# Patient Record
Sex: Female | Born: 1965 | Race: White | Hispanic: No | Marital: Married | Smoking: Former smoker
Health system: Southern US, Community
[De-identification: ages and names within clinical notes are randomized; demographics above are authoritative.]

## PROBLEM LIST (undated history)

## (undated) DIAGNOSIS — H9192 Unspecified hearing loss, left ear: Secondary | ICD-10-CM

## (undated) DIAGNOSIS — H9191 Unspecified hearing loss, right ear: Secondary | ICD-10-CM

## (undated) DIAGNOSIS — E079 Disorder of thyroid, unspecified: Secondary | ICD-10-CM

## (undated) HISTORY — DX: Unspecified hearing loss, right ear: H91.91

## (undated) HISTORY — DX: Unspecified hearing loss, left ear: H91.92

## (undated) HISTORY — PX: OTHER SURGICAL HISTORY: SHX169

## (undated) HISTORY — DX: Disorder of thyroid, unspecified: E07.9

---

## 1997-09-14 ENCOUNTER — Inpatient Hospital Stay (HOSPITAL_COMMUNITY): Admission: AD | Admit: 1997-09-14 | Discharge: 1997-09-14 | Payer: Self-pay | Admitting: Obstetrics and Gynecology

## 1998-02-17 ENCOUNTER — Inpatient Hospital Stay (HOSPITAL_COMMUNITY): Admission: AD | Admit: 1998-02-17 | Discharge: 1998-02-24 | Payer: Self-pay | Admitting: Obstetrics and Gynecology

## 1998-02-18 ENCOUNTER — Encounter: Payer: Self-pay | Admitting: Obstetrics and Gynecology

## 1998-02-24 ENCOUNTER — Encounter (HOSPITAL_COMMUNITY): Admission: RE | Admit: 1998-02-24 | Discharge: 1998-05-25 | Payer: Self-pay | Admitting: Obstetrics and Gynecology

## 2000-01-02 ENCOUNTER — Other Ambulatory Visit: Admission: RE | Admit: 2000-01-02 | Discharge: 2000-01-02 | Payer: Self-pay | Admitting: Obstetrics and Gynecology

## 2000-07-26 ENCOUNTER — Inpatient Hospital Stay (HOSPITAL_COMMUNITY): Admission: AD | Admit: 2000-07-26 | Discharge: 2000-07-28 | Payer: Self-pay | Admitting: Obstetrics and Gynecology

## 2000-08-29 ENCOUNTER — Other Ambulatory Visit: Admission: RE | Admit: 2000-08-29 | Discharge: 2000-08-29 | Payer: Self-pay | Admitting: Obstetrics and Gynecology

## 2003-01-05 ENCOUNTER — Other Ambulatory Visit: Admission: RE | Admit: 2003-01-05 | Discharge: 2003-01-05 | Payer: Self-pay | Admitting: Obstetrics and Gynecology

## 2004-12-21 ENCOUNTER — Other Ambulatory Visit: Admission: RE | Admit: 2004-12-21 | Discharge: 2004-12-21 | Payer: Self-pay | Admitting: Obstetrics and Gynecology

## 2005-01-03 ENCOUNTER — Ambulatory Visit (HOSPITAL_COMMUNITY): Admission: RE | Admit: 2005-01-03 | Discharge: 2005-01-03 | Payer: Self-pay | Admitting: Obstetrics and Gynecology

## 2005-01-03 ENCOUNTER — Encounter (INDEPENDENT_AMBULATORY_CARE_PROVIDER_SITE_OTHER): Payer: Self-pay | Admitting: *Deleted

## 2007-03-15 ENCOUNTER — Encounter: Admission: RE | Admit: 2007-03-15 | Discharge: 2007-03-15 | Payer: Self-pay | Admitting: Internal Medicine

## 2008-03-17 ENCOUNTER — Encounter: Admission: RE | Admit: 2008-03-17 | Discharge: 2008-03-17 | Payer: Self-pay | Admitting: Internal Medicine

## 2008-03-17 ENCOUNTER — Encounter (INDEPENDENT_AMBULATORY_CARE_PROVIDER_SITE_OTHER): Payer: Self-pay | Admitting: *Deleted

## 2008-06-27 ENCOUNTER — Emergency Department (HOSPITAL_COMMUNITY): Admission: EM | Admit: 2008-06-27 | Discharge: 2008-06-27 | Payer: Self-pay | Admitting: Emergency Medicine

## 2008-10-29 DIAGNOSIS — R51 Headache: Secondary | ICD-10-CM | POA: Insufficient documentation

## 2008-10-29 DIAGNOSIS — E039 Hypothyroidism, unspecified: Secondary | ICD-10-CM | POA: Insufficient documentation

## 2008-10-29 DIAGNOSIS — R519 Headache, unspecified: Secondary | ICD-10-CM | POA: Insufficient documentation

## 2008-10-30 ENCOUNTER — Ambulatory Visit: Payer: Self-pay | Admitting: Internal Medicine

## 2008-10-30 DIAGNOSIS — K7689 Other specified diseases of liver: Secondary | ICD-10-CM | POA: Insufficient documentation

## 2008-10-30 DIAGNOSIS — R740 Nonspecific elevation of levels of transaminase and lactic acid dehydrogenase [LDH]: Secondary | ICD-10-CM

## 2008-10-30 DIAGNOSIS — R7401 Elevation of levels of liver transaminase levels: Secondary | ICD-10-CM | POA: Insufficient documentation

## 2008-10-30 DIAGNOSIS — R932 Abnormal findings on diagnostic imaging of liver and biliary tract: Secondary | ICD-10-CM | POA: Insufficient documentation

## 2008-10-30 DIAGNOSIS — R7402 Elevation of levels of lactic acid dehydrogenase (LDH): Secondary | ICD-10-CM | POA: Insufficient documentation

## 2008-10-30 LAB — CONVERTED CEMR LAB
A-1 Antitrypsin, Ser: 186 mg/dL (ref 83–200)
HCV Ab: NEGATIVE
Prothrombin Time: 10.9 s (ref 9.1–11.7)
Saturation Ratios: 9.6 % — ABNORMAL LOW (ref 20.0–50.0)
Transferrin: 357.8 mg/dL (ref 212.0–360.0)

## 2008-11-25 ENCOUNTER — Ambulatory Visit: Payer: Self-pay | Admitting: Internal Medicine

## 2012-07-25 ENCOUNTER — Other Ambulatory Visit: Payer: Self-pay | Admitting: Otolaryngology

## 2012-07-25 DIAGNOSIS — H9319 Tinnitus, unspecified ear: Secondary | ICD-10-CM

## 2012-07-25 DIAGNOSIS — H903 Sensorineural hearing loss, bilateral: Secondary | ICD-10-CM

## 2012-07-25 DIAGNOSIS — H905 Unspecified sensorineural hearing loss: Secondary | ICD-10-CM

## 2012-08-07 ENCOUNTER — Other Ambulatory Visit: Payer: Self-pay

## 2013-04-10 ENCOUNTER — Other Ambulatory Visit: Payer: Self-pay | Admitting: Internal Medicine

## 2013-04-10 DIAGNOSIS — R7989 Other specified abnormal findings of blood chemistry: Secondary | ICD-10-CM

## 2013-04-10 DIAGNOSIS — R945 Abnormal results of liver function studies: Principal | ICD-10-CM

## 2013-04-11 ENCOUNTER — Ambulatory Visit
Admission: RE | Admit: 2013-04-11 | Discharge: 2013-04-11 | Disposition: A | Discharge status: Home / Self Care | Payer: BC Managed Care – PPO | Source: Ambulatory Visit | Attending: Internal Medicine | Admitting: Internal Medicine

## 2013-04-11 ENCOUNTER — Other Ambulatory Visit: Payer: Self-pay | Admitting: Internal Medicine

## 2013-04-11 DIAGNOSIS — R945 Abnormal results of liver function studies: Principal | ICD-10-CM

## 2013-04-11 DIAGNOSIS — R7989 Other specified abnormal findings of blood chemistry: Secondary | ICD-10-CM

## 2013-04-24 ENCOUNTER — Ambulatory Visit (INDEPENDENT_AMBULATORY_CARE_PROVIDER_SITE_OTHER): Payer: BC Managed Care – PPO

## 2013-04-24 ENCOUNTER — Ambulatory Visit: Payer: BC Managed Care – PPO | Admitting: Podiatry

## 2013-04-24 ENCOUNTER — Encounter: Payer: Self-pay | Admitting: Podiatry

## 2013-04-24 VITALS — BP 126/75 | HR 82 | Resp 16 | Ht 67.5 in | Wt 194.0 lb

## 2013-04-24 DIAGNOSIS — M722 Plantar fascial fibromatosis: Secondary | ICD-10-CM

## 2013-04-24 MED ORDER — METHYLPREDNISOLONE (PAK) 4 MG PO TABS: ORAL_TABLET | ORAL | Status: DC

## 2013-04-24 MED ORDER — MELOXICAM 15 MG PO TABS: 15.0000 mg | ORAL_TABLET | Freq: Every day | ORAL | Status: DC

## 2013-04-24 NOTE — Patient Instructions (Signed)
Plantar Fasciitis (Heel Spur Syndrome) with Rehab The plantar fascia is a fibrous, ligament-like, soft-tissue structure that spans the bottom of the foot. Plantar fasciitis is a condition that causes pain in the foot due to inflammation of the tissue. SYMPTOMS   Pain and tenderness on the underneath side of the foot.  Pain that worsens with standing or walking. CAUSES  Plantar fasciitis is caused by irritation and injury to the plantar fascia on the underneath side of the foot. Common mechanisms of injury include:  Direct trauma to bottom of the foot.  Damage to a small nerve that runs under the foot where the main fascia attaches to the heel bone.  Stress placed on the plantar fascia due to bone spurs. RISK INCREASES WITH:   Activities that place stress on the plantar fascia (running, jumping, pivoting, or cutting).  Poor strength and flexibility.  Improperly fitted shoes.  Tight calf muscles.  Flat feet.  Failure to warm-up properly before activity.  Obesity. PREVENTION  Warm up and stretch properly before activity.  Allow for adequate recovery between workouts.  Maintain physical fitness:  Strength, flexibility, and endurance.  Cardiovascular fitness.  Maintain a health body weight.  Avoid stress on the plantar fascia.  Wear properly fitted shoes, including arch supports for individuals who have flat feet. PROGNOSIS  If treated properly, then the symptoms of plantar fasciitis usually resolve without surgery. However, occasionally surgery is necessary. RELATED COMPLICATIONS   Recurrent symptoms that may result in a chronic condition.  Problems of the lower back that are caused by compensating for the injury, such as limping.  Pain or weakness of the foot during push-off following surgery.  Chronic inflammation, scarring, and partial or complete fascia tear, occurring more often from repeated injections. TREATMENT  Treatment initially involves the use of  ice and medication to help reduce pain and inflammation. The use of strengthening and stretching exercises may help reduce pain with activity, especially stretches of the Achilles tendon. These exercises may be performed at home or with a therapist. Your caregiver may recommend that you use heel cups of arch supports to help reduce stress on the plantar fascia. Occasionally, corticosteroid injections are given to reduce inflammation. If symptoms persist for greater than 6 months despite non-surgical (conservative), then surgery may be recommended.  MEDICATION   If pain medication is necessary, then nonsteroidal anti-inflammatory medications, such as aspirin and ibuprofen, or other minor pain relievers, such as acetaminophen, are often recommended.  Do not take pain medication within 7 days before surgery.  Prescription pain relievers may be given if deemed necessary by your caregiver. Use only as directed and only as much as you need.  Corticosteroid injections may be given by your caregiver. These injections should be reserved for the most serious cases, because they may only be given a certain number of times. HEAT AND COLD  Cold treatment (icing) relieves pain and reduces inflammation. Cold treatment should be applied for 10 to 15 minutes every 2 to 3 hours for inflammation and pain and immediately after any activity that aggravates your symptoms. Use ice packs or massage the area with a piece of ice (ice massage).  Heat treatment may be used prior to performing the stretching and strengthening activities prescribed by your caregiver, physical therapist, or athletic trainer. Use a heat pack or soak the injury in warm water. SEEK IMMEDIATE MEDICAL CARE IF:  Treatment seems to offer no benefit, or the condition worsens.  Any medications produce adverse side effects. EXERCISES RANGE   OF MOTION (ROM) AND STRETCHING EXERCISES - Plantar Fasciitis (Heel Spur Syndrome) These exercises may help you  when beginning to rehabilitate your injury. Your symptoms may resolve with or without further involvement from your physician, physical therapist or athletic trainer. While completing these exercises, remember:   Restoring tissue flexibility helps normal motion to return to the joints. This allows healthier, less painful movement and activity.  An effective stretch should be held for at least 30 seconds.  A stretch should never be painful. You should only feel a gentle lengthening or release in the stretched tissue. RANGE OF MOTION - Toe Extension, Flexion  Sit with your right / left leg crossed over your opposite knee.  Grasp your toes and gently pull them back toward the top of your foot. You should feel a stretch on the bottom of your toes and/or foot.  Hold this stretch for __________ seconds.  Now, gently pull your toes toward the bottom of your foot. You should feel a stretch on the top of your toes and or foot.  Hold this stretch for __________ seconds. Repeat __________ times. Complete this stretch __________ times per day.  RANGE OF MOTION - Ankle Dorsiflexion, Active Assisted  Remove shoes and sit on a chair that is preferably not on a carpeted surface.  Place right / left foot under knee. Extend your opposite leg for support.  Keeping your heel down, slide your right / left foot back toward the chair until you feel a stretch at your ankle or calf. If you do not feel a stretch, slide your bottom forward to the edge of the chair, while still keeping your heel down.  Hold this stretch for __________ seconds. Repeat __________ times. Complete this stretch __________ times per day.  STRETCH  Gastroc, Standing  Place hands on wall.  Extend right / left leg, keeping the front knee somewhat bent.  Slightly point your toes inward on your back foot.  Keeping your right / left heel on the floor and your knee straight, shift your weight toward the wall, not allowing your back to  arch.  You should feel a gentle stretch in the right / left calf. Hold this position for __________ seconds. Repeat __________ times. Complete this stretch __________ times per day. STRETCH  Soleus, Standing  Place hands on wall.  Extend right / left leg, keeping the other knee somewhat bent.  Slightly point your toes inward on your back foot.  Keep your right / left heel on the floor, bend your back knee, and slightly shift your weight over the back leg so that you feel a gentle stretch deep in your back calf.  Hold this position for __________ seconds. Repeat __________ times. Complete this stretch __________ times per day. STRETCH  Gastrocsoleus, Standing  Note: This exercise can place a lot of stress on your foot and ankle. Please complete this exercise only if specifically instructed by your caregiver.   Place the ball of your right / left foot on a step, keeping your other foot firmly on the same step.  Hold on to the wall or a rail for balance.  Slowly lift your other foot, allowing your body weight to press your heel down over the edge of the step.  You should feel a stretch in your right / left calf.  Hold this position for __________ seconds.  Repeat this exercise with a slight bend in your right / left knee. Repeat __________ times. Complete this stretch __________ times per day.    STRENGTHENING EXERCISES - Plantar Fasciitis (Heel Spur Syndrome)  These exercises may help you when beginning to rehabilitate your injury. They may resolve your symptoms with or without further involvement from your physician, physical therapist or athletic trainer. While completing these exercises, remember:   Muscles can gain both the endurance and the strength needed for everyday activities through controlled exercises.  Complete these exercises as instructed by your physician, physical therapist or athletic trainer. Progress the resistance and repetitions only as guided. STRENGTH - Towel  Curls  Sit in a chair positioned on a non-carpeted surface.  Place your foot on a towel, keeping your heel on the floor.  Pull the towel toward your heel by only curling your toes. Keep your heel on the floor.  If instructed by your physician, physical therapist or athletic trainer, add ____________________ at the end of the towel. Repeat __________ times. Complete this exercise __________ times per day. STRENGTH - Ankle Inversion  Secure one end of a rubber exercise band/tubing to a fixed object (table, pole). Loop the other end around your foot just before your toes.  Place your fists between your knees. This will focus your strengthening at your ankle.  Slowly, pull your big toe up and in, making sure the band/tubing is positioned to resist the entire motion.  Hold this position for __________ seconds.  Have your muscles resist the band/tubing as it slowly pulls your foot back to the starting position. Repeat __________ times. Complete this exercises __________ times per day.  Document Released: 02/06/2005 Document Revised: 05/01/2011 Document Reviewed: 05/21/2008 [employer] Patient Information 2014 [employer]. Plantar Fasciitis Plantar fasciitis is a common condition that causes foot pain. It is soreness (inflammation) of the band of tough fibrous tissue on the bottom of the foot that runs from the heel bone (calcaneus) to the ball of the foot. The cause of this soreness may be from excessive standing, poor fitting shoes, running on hard surfaces, being overweight, having an abnormal walk, or overuse (this is common in runners) of the painful foot or feet. It is also common in aerobic exercise dancers and ballet dancers. SYMPTOMS  Most people with plantar fasciitis complain of:  Severe pain in the morning on the bottom of their foot especially when taking the first steps out of bed. This pain recedes after a few minutes of walking.  Severe pain is experienced also during walking  following a long period of inactivity.  Pain is worse when walking barefoot or up stairs DIAGNOSIS   Your caregiver will diagnose this condition by examining and feeling your foot.  Special tests such as X-rays of your foot, are usually not needed. PREVENTION   Consult a sports medicine professional before beginning a new exercise program.  Walking programs offer a good workout. With walking there is a lower chance of overuse injuries common to runners. There is less impact and less jarring of the joints.  Begin all new exercise programs slowly. If problems or pain develop, decrease the amount of time or distance until you are at a comfortable level.  Wear good shoes and replace them regularly.  Stretch your foot and the heel cords at the back of the ankle (Achilles tendon) both before and after exercise.  Run or exercise on even surfaces that are not hard. For example, asphalt is better than pavement.  Do not run barefoot on hard surfaces.  If using a treadmill, vary the incline.  Do not continue to workout if you have foot or joint   problems. Seek professional help if they do not improve. HOME CARE INSTRUCTIONS   Avoid activities that cause you pain until you recover.  Use ice or cold packs on the problem or painful areas after working out.  Only take over-the-counter or prescription medicines for pain, discomfort, or fever as directed by your caregiver.  Soft shoe inserts or athletic shoes with air or gel sole cushions may be helpful.  If problems continue or become more severe, consult a sports medicine caregiver or your own health care provider. Cortisone is a potent anti-inflammatory medication that may be injected into the painful area. You can discuss this treatment with your caregiver. MAKE SURE YOU:   Understand these instructions.  Will watch your condition.  Will get help right away if you are not doing well or get worse. Document Released: 11/01/2000 Document  Revised: 05/01/2011 Document Reviewed: 01/01/2008 [employer] Patient Information 2014 [employer].  

## 2013-04-24 NOTE — Progress Notes (Signed)
   Subjective:    Patient ID: Judith Stewart, female    DOB: Oct 18, 1965, 48 y.o.   MRN: [id number]  HPI Comments: Right foot heel pain noticed in December kind of a faint pain, mornings are worse like a stabbing pain, as day goes on it gets a little better, but always feel it . It has got worse since first noticing , compensating walking on the heel. Starting to feel it go up the back of the heel. Been using tylenol and stretching and flexing foot .   Foot Pain      Review of Systems  HENT:       Hearing aid in right ear and deaf in left ear  Musculoskeletal:       Difficulty walking due to foot pain   Neurological:       Migraines   All other systems reviewed and are negative.       Objective:   Physical Exam: I have reviewed her past medical history medications allergies surgeries and social history. Pulses are strongly palpable right foot. Neurologic sensorium is intact per since once the monofilament. Deep tendon reflexes are intact bilateral muscle strength is 5 over 5 dorsiflexors plantar flexors inverters everters all intrinsic musculature is intact. Orthopedic evaluation demonstrates all joints distal to the ankle have a full range of motion without crepitation. She has pain on palpation medial calcaneal tubercle of the right heel. Radiographic evaluation does demonstrate plantar distally oriented calcaneal heel spur with soft tissue increase in density at the plantar fascial calcaneal insertion site. Cutaneous evaluation demonstrates supple well hydrated cutis no erythema edema cellulitis drainage or odor.        Assessment & Plan:  Assessment: Plantar fasciitis right foot.  Plan: Discussed the etiology pathology conservative versus surgical therapies. Injected the right heel today with Kenalog and local anesthetic. Plantar fascial brace was dispensed. Plantar fascial night splint was dispensed. We prescribed a Medrol Dosepak to be followed by [facility]. We discussed appropriate  shoe gear stretching exercises ice therapy and shoe gear modifications area I will followup with her in one month.

## 2013-05-22 ENCOUNTER — Ambulatory Visit (INDEPENDENT_AMBULATORY_CARE_PROVIDER_SITE_OTHER): Payer: BC Managed Care – PPO | Admitting: Podiatry

## 2013-05-22 ENCOUNTER — Encounter: Payer: Self-pay | Admitting: Podiatry

## 2013-05-22 VITALS — BP 112/71 | HR 88 | Resp 16

## 2013-05-22 DIAGNOSIS — M722 Plantar fascial fibromatosis: Secondary | ICD-10-CM

## 2013-05-22 DIAGNOSIS — M2010 Hallux valgus (acquired), unspecified foot: Secondary | ICD-10-CM

## 2013-05-22 NOTE — Progress Notes (Signed)
She presents today for followup of a plantar fasciitis left foot. She states it is about 80% better.  Objective: Vital signs are stable she is alert and oriented x3. She has minimal pain on palpation medial continued tubercle of the left heel.  Assessment: Plantar fasciitis resolving.  Plan: Continue all conservative therapies until she is 1 month out. I will followup with her as needed.

## 2013-07-30 ENCOUNTER — Ambulatory Visit (INDEPENDENT_AMBULATORY_CARE_PROVIDER_SITE_OTHER): Payer: BC Managed Care – PPO | Admitting: General Surgery

## 2013-07-30 ENCOUNTER — Encounter (INDEPENDENT_AMBULATORY_CARE_PROVIDER_SITE_OTHER): Payer: Self-pay | Admitting: General Surgery

## 2013-07-30 VITALS — BP 126/80 | HR 78 | Temp 97.8°F | Ht 67.0 in | Wt 196.0 lb

## 2013-07-30 DIAGNOSIS — E65 Localized adiposity: Secondary | ICD-10-CM

## 2013-07-30 NOTE — Progress Notes (Signed)
Chief complaint: Excess tissue right axilla  History: Patient comes to the office referred by Dr. Matthew Saras due to excess tissue or fat pad in the right axilla. She has noticed this for a number of years but it appears to be getting gradually larger over time. She notices a little enlargement bilaterally but significantly more on the right than the left.  This is not painful. No fluctuation with menstrual cycle. There does seem to be gradual enlargement. Gives her difficulty with clothing and somewhat getting in the way of her upper arm.  Past Medical History  Diagnosis Date  . Thyroid disease   . Deafness in left ear   . Partial deafness of right ear     hearing aid for right ear    Past Surgical History  Procedure Laterality Date  . Cesarean section    . Laprascopic surgery     Current Outpatient Prescriptions  Medication Sig Dispense Refill  . levothyroxine (SYNTHROID, LEVOTHROID) 175 MCG tablet Take 175 mcg by mouth daily before breakfast.      . norgestrel-ethinyl estradiol (LO/OVRAL,CRYSELLE) 0.3-30 MG-MCG tablet Take 1 tablet by mouth daily.       No current facility-administered medications for this visit.   No Known Allergies History  Substance Use Topics  . Smoking status: Former Smoker    Quit date: 07/30/1993  . Smokeless tobacco: Never Used  . Alcohol Use: No   Exam: BP 126/80  Pulse 78  Temp(Src) 97.8 F (36.6 C)  Ht 5\' 7"  (1.702 m)  Wt 196 lb (88.905 kg)  BMI 30.69 kg/m2 Overweight but well-appearing Caucasian female. Lymph nodes: No cervical supraclavicular or axillary nodes palpable Skin: No rash or infection Lungs: No wheezing or increased work of breathing Breasts: No masses. In the right axilla is a very noticeable ptotic fold of tissue and skin extending from just anterior to just posterior to the axilla. No distinct mass within this. She has a little bit of excess skin and soft tissue in the left axilla but not nearly the degree of the right  side.  Assessment and plan: Excess tissue right axilla consistent with right axillary fat pad. It is asymmetric and enlarging. Cannot rule out ectopic breast tissue. I believe she would need significant excision of skin as well as underlying soft tissue. I described the procedure an incision that would be used and recovery as well as risks of bleeding infection anesthetic complications. I discussed about be happy to c a plastic surgeon to see if they had any other ideas. She will consider her options and get back to me.

## 2015-06-28 DIAGNOSIS — Z6833 Body mass index (BMI) 33.0-33.9, adult: Secondary | ICD-10-CM | POA: Diagnosis not present

## 2015-06-28 DIAGNOSIS — Z01419 Encounter for gynecological examination (general) (routine) without abnormal findings: Secondary | ICD-10-CM | POA: Diagnosis not present

## 2015-06-28 DIAGNOSIS — Z1231 Encounter for screening mammogram for malignant neoplasm of breast: Secondary | ICD-10-CM | POA: Diagnosis not present

## 2015-10-26 DIAGNOSIS — E038 Other specified hypothyroidism: Secondary | ICD-10-CM | POA: Diagnosis not present

## 2015-10-26 DIAGNOSIS — E784 Other hyperlipidemia: Secondary | ICD-10-CM | POA: Diagnosis not present

## 2015-10-26 DIAGNOSIS — Z23 Encounter for immunization: Secondary | ICD-10-CM | POA: Diagnosis not present

## 2015-10-26 DIAGNOSIS — K769 Liver disease, unspecified: Secondary | ICD-10-CM | POA: Diagnosis not present

## 2015-10-26 DIAGNOSIS — R030 Elevated blood-pressure reading, without diagnosis of hypertension: Secondary | ICD-10-CM | POA: Diagnosis not present

## 2015-10-26 DIAGNOSIS — E668 Other obesity: Secondary | ICD-10-CM | POA: Diagnosis not present

## 2016-04-18 DIAGNOSIS — E038 Other specified hypothyroidism: Secondary | ICD-10-CM | POA: Diagnosis not present

## 2016-04-18 DIAGNOSIS — Z0000 Encounter for general adult medical examination without abnormal findings: Secondary | ICD-10-CM | POA: Diagnosis not present

## 2016-04-25 DIAGNOSIS — K7689 Other specified diseases of liver: Secondary | ICD-10-CM | POA: Diagnosis not present

## 2016-04-25 DIAGNOSIS — Z1389 Encounter for screening for other disorder: Secondary | ICD-10-CM | POA: Diagnosis not present

## 2016-04-25 DIAGNOSIS — M25521 Pain in right elbow: Secondary | ICD-10-CM | POA: Diagnosis not present

## 2016-04-25 DIAGNOSIS — R030 Elevated blood-pressure reading, without diagnosis of hypertension: Secondary | ICD-10-CM | POA: Diagnosis not present

## 2016-04-25 DIAGNOSIS — Z0000 Encounter for general adult medical examination without abnormal findings: Secondary | ICD-10-CM | POA: Diagnosis not present

## 2016-04-25 DIAGNOSIS — E784 Other hyperlipidemia: Secondary | ICD-10-CM | POA: Diagnosis not present

## 2016-05-05 ENCOUNTER — Encounter: Payer: Self-pay | Admitting: Internal Medicine

## 2016-07-06 ENCOUNTER — Ambulatory Visit (AMBULATORY_SURGERY_CENTER): Payer: Self-pay

## 2016-07-06 VITALS — Ht 67.0 in | Wt 216.6 lb

## 2016-07-06 DIAGNOSIS — Z1211 Encounter for screening for malignant neoplasm of colon: Secondary | ICD-10-CM

## 2016-07-06 MED ORDER — NA SULFATE-K SULFATE-MG SULF 17.5-3.13-1.6 GM/177ML PO SOLN: 1.0000 | Freq: Once | ORAL | 0 refills | Status: AC

## 2016-07-06 NOTE — Progress Notes (Signed)
Denies allergies to eggs or soy products. Denies complication of anesthesia or sedation. Denies use of weight loss medication. Denies use of O2.   Emmi instructions given for colonoscopy.  

## 2016-07-10 ENCOUNTER — Encounter: Payer: Self-pay | Admitting: Internal Medicine

## 2016-07-20 ENCOUNTER — Ambulatory Visit (AMBULATORY_SURGERY_CENTER): Payer: BLUE CROSS/BLUE SHIELD | Admitting: Internal Medicine

## 2016-07-20 ENCOUNTER — Encounter: Payer: Self-pay | Admitting: Internal Medicine

## 2016-07-20 VITALS — BP 102/54 | HR 71 | Temp 97.3°F | Resp 11 | Ht 67.0 in | Wt 216.0 lb

## 2016-07-20 DIAGNOSIS — D122 Benign neoplasm of ascending colon: Secondary | ICD-10-CM | POA: Diagnosis not present

## 2016-07-20 DIAGNOSIS — Z1212 Encounter for screening for malignant neoplasm of rectum: Secondary | ICD-10-CM

## 2016-07-20 DIAGNOSIS — Z1211 Encounter for screening for malignant neoplasm of colon: Secondary | ICD-10-CM

## 2016-07-20 MED ORDER — SODIUM CHLORIDE 0.9 % IV SOLN: 500.0000 mL | INTRAVENOUS | Status: DC

## 2016-07-20 NOTE — Progress Notes (Signed)
Called to room to assist during endoscopic procedure.  Patient ID and intended procedure confirmed with present staff. Received instructions for my participation in the procedure from the performing physician.  

## 2016-07-20 NOTE — Op Note (Signed)
[place] Patient Name: Judith Stewart Procedure Date: 07/20/2016 1:21 PM MRN: [id number] Endoscopist: Docia Chuck. Henrene Pastor , MD Age: 51 Referring MD:  Date of Birth: [birth year] Gender: Female Account #: [id number] Procedure:                Colonoscopy, with cold snare polypectomy X2 Indications:              Screening for colorectal malignant neoplasm Medicines:                Monitored Anesthesia Care Procedure:                Pre-Anesthesia Assessment:                           - Prior to the procedure, a History and Physical                            was performed, and patient medications and                            allergies were reviewed. The patient's tolerance of                            previous anesthesia was also reviewed. The risks                            and benefits of the procedure and the sedation                            options and risks were discussed with the patient.                            All questions were answered, and informed consent                            was obtained. Prior Anticoagulants: The patient has                            taken no previous anticoagulant or antiplatelet                            agents. ASA Grade Assessment: II - A patient with                            mild systemic disease. After reviewing the risks                            and benefits, the patient was deemed in                            satisfactory condition to undergo the procedure.                           After obtaining informed consent, the colonoscope  was passed under direct vision. Throughout the                            procedure, the patient's blood pressure, pulse, and                            oxygen saturations were monitored continuously. The                            Model CF-HQ190L [id number] [id number]) scope was introduced                            through the anus and advanced to the the cecum,             identified by appendiceal orifice and ileocecal                            valve. The terminal ileum, ileocecal valve,                            appendiceal orifice, and rectum were photographed.                            The quality of the bowel preparation was excellent.                            The colonoscopy was performed without difficulty.                            The patient tolerated the procedure well. The bowel                            preparation used was SUPREP. Scope In: 1:32:30 PM Scope Out: 1:51:13 PM Scope Withdrawal Time: 0 hours 15 minutes 51 seconds  Total Procedure Duration: 0 hours 18 minutes 43 seconds  Findings:                 The terminal ileum appeared normal.                           Two polyps were found in the ascending colon. The                            polyps were 1 to 2 mm in size. These polyps were                            removed with a cold snare. Resection and retrieval                            were complete.                           Internal hemorrhoids were found during  retroflexion. The hemorrhoids were small.                           The exam was otherwise without abnormality on                            direct and retroflexion views. The appendix was                            inverted the cecal lumen. Complications:            No immediate complications. Estimated blood loss:                            None. Estimated Blood Loss:     Estimated blood loss: none. Impression:               - The examined portion of the ileum was normal.                           - Two 1 to 2 mm polyps in the ascending colon,                            removed with a cold snare. Resected and retrieved.                           - Internal hemorrhoids.                           - The examination was otherwise normal on direct                            and retroflexion views. Recommendation:           - Repeat  colonoscopy in 5-10 years for surveillance.                           - Patient has a contact number available for                            emergencies. The signs and symptoms of potential                            delayed complications were discussed with the                            patient. Return to normal activities tomorrow.                            Written discharge instructions were provided to the                            patient.                           - Resume previous diet.                           -  Continue present medications.                           - Await pathology results. Docia Chuck. Henrene Pastor, MD 07/20/2016 1:56:58 PM This report has been signed electronically.

## 2016-07-20 NOTE — Patient Instructions (Signed)
Impression/Recommendations:  Polyp handout given to patient. Hemorrhoid handout given to patient.  Repeat colonoscopy in 5-10 years for surveillance.  Resume previous diet. Continue present medications.  YOU HAD AN ENDOSCOPIC PROCEDURE TODAY AT [facility]:   Refer to the procedure report that was given to you for any specific questions about what was found during the examination.  If the procedure report does not answer your questions, please call your gastroenterologist to clarify.  If you requested that your care partner not be given the details of your procedure findings, then the procedure report has been included in a sealed envelope for you to review at your convenience later.  YOU SHOULD EXPECT: Some feelings of bloating in the abdomen. Passage of more gas than usual.  Walking can help get rid of the air that was put into your GI tract during the procedure and reduce the bloating. If you had a lower endoscopy (such as a colonoscopy or flexible sigmoidoscopy) you may notice spotting of blood in your stool or on the toilet paper. If you underwent a bowel prep for your procedure, you may not have a normal bowel movement for a few days.  Please Note:  You might notice some irritation and congestion in your nose or some drainage.  This is from the oxygen used during your procedure.  There is no need for concern and it should clear up in a day or so.  SYMPTOMS TO REPORT IMMEDIATELY:   Following lower endoscopy (colonoscopy or flexible sigmoidoscopy):  Excessive amounts of blood in the stool  Significant tenderness or worsening of abdominal pains  Swelling of the abdomen that is new, acute  Fever of 100F or higher  For urgent or emergent issues, a gastroenterologist can be reached at any hour by calling [phone].   DIET:  We do recommend a small meal at first, but then you may proceed to your regular diet.  Drink plenty of fluids but you should avoid alcoholic  beverages for 24 hours.  ACTIVITY:  You should plan to take it easy for the rest of today and you should NOT DRIVE or use heavy machinery until tomorrow (because of the sedation medicines used during the test).    FOLLOW UP: Our staff will call the number listed on your records the next business day following your procedure to check on you and address any questions or concerns that you may have regarding the information given to you following your procedure. If we do not reach you, we will leave a message.  However, if you are feeling well and you are not experiencing any problems, there is no need to return our call.  We will assume that you have returned to your regular daily activities without incident.  If any biopsies were taken you will be contacted by phone or by letter within the next 1-3 weeks.  Please call us at [phone] if you have not heard about the biopsies in 3 weeks.    SIGNATURES/CONFIDENTIALITY: You and/or your care partner have signed paperwork which will be entered into your electronic medical record.  These signatures attest to the fact that that the information above on your After Visit Summary has been reviewed and is understood.  Full responsibility of the confidentiality of this discharge information lies with you and/or your care-partner.

## 2016-07-20 NOTE — Progress Notes (Signed)
Report to PACU, RN, vss, BBS= Clear.  

## 2016-07-20 NOTE — Progress Notes (Signed)
Pt's states no medical or surgical changes since previsit or office visit. 

## 2016-07-21 ENCOUNTER — Telehealth: Payer: Self-pay

## 2016-07-21 NOTE — Telephone Encounter (Signed)
   Follow up Call-  Call back number 07/20/2016  Post procedure Call Back phone  # [phone]  Permission to leave phone message Yes  Some recent data might be hidden     Patient questions:  Do you have a fever, pain , or abdominal swelling? No. Pain Score  0 *  Have you tolerated food without any problems? Yes.    Have you been able to return to your normal activities? Yes.    Do you have any questions about your discharge instructions: Diet   No. Medications  No. Follow up visit  No.  Do you have questions or concerns about your Care? No.  Actions: * If pain score is 4 or above: No action needed, pain <4.

## 2016-07-25 ENCOUNTER — Encounter: Payer: Self-pay | Admitting: Internal Medicine

## 2016-08-08 DIAGNOSIS — Z6833 Body mass index (BMI) 33.0-33.9, adult: Secondary | ICD-10-CM | POA: Diagnosis not present

## 2016-08-08 DIAGNOSIS — Z01419 Encounter for gynecological examination (general) (routine) without abnormal findings: Secondary | ICD-10-CM | POA: Diagnosis not present

## 2016-08-08 DIAGNOSIS — Z1231 Encounter for screening mammogram for malignant neoplasm of breast: Secondary | ICD-10-CM | POA: Diagnosis not present

## 2016-11-14 DIAGNOSIS — K769 Liver disease, unspecified: Secondary | ICD-10-CM | POA: Diagnosis not present

## 2016-11-14 DIAGNOSIS — N951 Menopausal and female climacteric states: Secondary | ICD-10-CM | POA: Diagnosis not present

## 2016-11-14 DIAGNOSIS — R030 Elevated blood-pressure reading, without diagnosis of hypertension: Secondary | ICD-10-CM | POA: Diagnosis not present

## 2016-11-14 DIAGNOSIS — M25561 Pain in right knee: Secondary | ICD-10-CM | POA: Diagnosis not present

## 2016-11-14 DIAGNOSIS — E038 Other specified hypothyroidism: Secondary | ICD-10-CM | POA: Diagnosis not present

## 2016-11-14 DIAGNOSIS — F4321 Adjustment disorder with depressed mood: Secondary | ICD-10-CM | POA: Diagnosis not present

## 2016-11-14 DIAGNOSIS — Z23 Encounter for immunization: Secondary | ICD-10-CM | POA: Diagnosis not present

## 2017-04-20 DIAGNOSIS — Z0000 Encounter for general adult medical examination without abnormal findings: Secondary | ICD-10-CM | POA: Diagnosis not present

## 2017-04-20 DIAGNOSIS — E038 Other specified hypothyroidism: Secondary | ICD-10-CM | POA: Diagnosis not present

## 2017-04-27 DIAGNOSIS — Z0000 Encounter for general adult medical examination without abnormal findings: Secondary | ICD-10-CM | POA: Diagnosis not present

## 2017-04-27 DIAGNOSIS — F4321 Adjustment disorder with depressed mood: Secondary | ICD-10-CM | POA: Diagnosis not present

## 2017-04-27 DIAGNOSIS — M25561 Pain in right knee: Secondary | ICD-10-CM | POA: Diagnosis not present

## 2017-04-27 DIAGNOSIS — R030 Elevated blood-pressure reading, without diagnosis of hypertension: Secondary | ICD-10-CM | POA: Diagnosis not present

## 2017-04-27 DIAGNOSIS — Z1389 Encounter for screening for other disorder: Secondary | ICD-10-CM | POA: Diagnosis not present

## 2017-04-27 DIAGNOSIS — Z23 Encounter for immunization: Secondary | ICD-10-CM | POA: Diagnosis not present

## 2017-04-27 DIAGNOSIS — K7689 Other specified diseases of liver: Secondary | ICD-10-CM | POA: Diagnosis not present

## 2017-09-26 DIAGNOSIS — Z1231 Encounter for screening mammogram for malignant neoplasm of breast: Secondary | ICD-10-CM | POA: Diagnosis not present

## 2017-09-26 DIAGNOSIS — Z6833 Body mass index (BMI) 33.0-33.9, adult: Secondary | ICD-10-CM | POA: Diagnosis not present

## 2017-09-26 DIAGNOSIS — Z01419 Encounter for gynecological examination (general) (routine) without abnormal findings: Secondary | ICD-10-CM | POA: Diagnosis not present

## 2017-11-07 DIAGNOSIS — Z780 Asymptomatic menopausal state: Secondary | ICD-10-CM | POA: Diagnosis not present

## 2017-12-20 DIAGNOSIS — Z6833 Body mass index (BMI) 33.0-33.9, adult: Secondary | ICD-10-CM | POA: Diagnosis not present

## 2017-12-20 DIAGNOSIS — R42 Dizziness and giddiness: Secondary | ICD-10-CM | POA: Diagnosis not present

## 2017-12-20 DIAGNOSIS — E038 Other specified hypothyroidism: Secondary | ICD-10-CM | POA: Diagnosis not present

## 2018-01-07 DIAGNOSIS — H9311 Tinnitus, right ear: Secondary | ICD-10-CM | POA: Diagnosis not present

## 2018-01-07 DIAGNOSIS — H903 Sensorineural hearing loss, bilateral: Secondary | ICD-10-CM | POA: Diagnosis not present

## 2018-01-07 DIAGNOSIS — H9121 Sudden idiopathic hearing loss, right ear: Secondary | ICD-10-CM | POA: Diagnosis not present

## 2018-01-09 ENCOUNTER — Other Ambulatory Visit: Payer: Self-pay | Admitting: Physician Assistant

## 2018-01-09 DIAGNOSIS — H9311 Tinnitus, right ear: Secondary | ICD-10-CM

## 2018-01-09 DIAGNOSIS — H9121 Sudden idiopathic hearing loss, right ear: Secondary | ICD-10-CM

## 2018-01-31 DIAGNOSIS — H829 Vertiginous syndromes in diseases classified elsewhere, unspecified ear: Secondary | ICD-10-CM | POA: Diagnosis not present

## 2018-01-31 DIAGNOSIS — H903 Sensorineural hearing loss, bilateral: Secondary | ICD-10-CM | POA: Diagnosis not present

## 2018-01-31 DIAGNOSIS — H9311 Tinnitus, right ear: Secondary | ICD-10-CM | POA: Diagnosis not present

## 2018-02-07 ENCOUNTER — Other Ambulatory Visit: Payer: Self-pay | Admitting: Otolaryngology

## 2018-02-07 DIAGNOSIS — H903 Sensorineural hearing loss, bilateral: Secondary | ICD-10-CM

## 2018-02-07 DIAGNOSIS — H9311 Tinnitus, right ear: Secondary | ICD-10-CM

## 2018-02-07 DIAGNOSIS — H829 Vertiginous syndromes in diseases classified elsewhere, unspecified ear: Secondary | ICD-10-CM

## 2018-02-11 ENCOUNTER — Other Ambulatory Visit: Payer: Self-pay | Admitting: Physician Assistant

## 2018-02-11 ENCOUNTER — Other Ambulatory Visit: Payer: Self-pay | Admitting: Otolaryngology

## 2018-02-19 ENCOUNTER — Ambulatory Visit
Admission: RE | Admit: 2018-02-19 | Discharge: 2018-02-19 | Disposition: A | Discharge status: Home / Self Care | Payer: BLUE CROSS/BLUE SHIELD | Source: Ambulatory Visit | Attending: Otolaryngology | Admitting: Otolaryngology

## 2018-02-19 ENCOUNTER — Ambulatory Visit
Admission: RE | Admit: 2018-02-19 | Discharge: 2018-02-19 | Disposition: A | Discharge status: Home / Self Care | Payer: BLUE CROSS/BLUE SHIELD | Source: Ambulatory Visit | Attending: Physician Assistant | Admitting: Physician Assistant

## 2018-02-19 DIAGNOSIS — H829 Vertiginous syndromes in diseases classified elsewhere, unspecified ear: Secondary | ICD-10-CM

## 2018-02-19 DIAGNOSIS — H9311 Tinnitus, right ear: Secondary | ICD-10-CM | POA: Diagnosis not present

## 2018-02-19 DIAGNOSIS — H9191 Unspecified hearing loss, right ear: Secondary | ICD-10-CM | POA: Diagnosis not present

## 2018-02-19 DIAGNOSIS — H9121 Sudden idiopathic hearing loss, right ear: Secondary | ICD-10-CM

## 2018-02-19 DIAGNOSIS — H903 Sensorineural hearing loss, bilateral: Secondary | ICD-10-CM | POA: Diagnosis not present

## 2018-02-19 MED ORDER — GADOBENATE DIMEGLUMINE 529 MG/ML IV SOLN
20.0000 mL | Freq: Once | INTRAVENOUS | Status: AC | PRN
  Administered 2018-02-19: 20 mL via INTRAVENOUS

## 2018-05-03 ENCOUNTER — Other Ambulatory Visit: Payer: Self-pay | Admitting: Internal Medicine

## 2018-05-03 DIAGNOSIS — E785 Hyperlipidemia, unspecified: Secondary | ICD-10-CM

## 2018-05-10 ENCOUNTER — Ambulatory Visit
Admission: RE | Admit: 2018-05-10 | Discharge: 2018-05-10 | Disposition: A | Discharge status: Home / Self Care | Payer: Self-pay | Source: Ambulatory Visit | Attending: Internal Medicine | Admitting: Internal Medicine

## 2018-05-10 DIAGNOSIS — E785 Hyperlipidemia, unspecified: Secondary | ICD-10-CM

## 2019-04-25 ENCOUNTER — Ambulatory Visit: Payer: BLUE CROSS/BLUE SHIELD

## 2019-06-30 ENCOUNTER — Encounter: Payer: Self-pay | Admitting: Podiatry

## 2019-06-30 ENCOUNTER — Other Ambulatory Visit: Payer: Self-pay

## 2019-06-30 ENCOUNTER — Ambulatory Visit (INDEPENDENT_AMBULATORY_CARE_PROVIDER_SITE_OTHER): Payer: 59

## 2019-06-30 ENCOUNTER — Ambulatory Visit (INDEPENDENT_AMBULATORY_CARE_PROVIDER_SITE_OTHER): Payer: 59 | Admitting: Podiatry

## 2019-06-30 DIAGNOSIS — M2041 Other hammer toe(s) (acquired), right foot: Secondary | ICD-10-CM | POA: Diagnosis not present

## 2019-06-30 DIAGNOSIS — M79671 Pain in right foot: Secondary | ICD-10-CM

## 2019-06-30 DIAGNOSIS — M21619 Bunion of unspecified foot: Secondary | ICD-10-CM

## 2019-06-30 DIAGNOSIS — M79672 Pain in left foot: Secondary | ICD-10-CM

## 2019-06-30 DIAGNOSIS — M2042 Other hammer toe(s) (acquired), left foot: Secondary | ICD-10-CM

## 2019-06-30 NOTE — Patient Instructions (Signed)
Bunion  A bunion is a bump on the base of the big toe that forms when the bones of the big toe joint move out of position. Bunions may be small at first, but they often get larger over time. They can make walking painful. What are the causes? A bunion may be caused by:  Wearing narrow or pointed shoes that force the big toe to press against the other toes.  Abnormal foot development that causes the foot to roll inward (pronate).  Changes in the foot that are caused by certain diseases, such as rheumatoid arthritis or polio.  A foot injury. What increases the risk? The following factors may make you more likely to develop this condition:  Wearing shoes that squeeze the toes together.  Having certain diseases, such as: ? Rheumatoid arthritis. ? Polio. ? Cerebral palsy.  Having family members who have bunions.  Being born with a foot deformity, such as flat feet or low arches.  Doing activities that put a lot of pressure on the feet, such as ballet dancing. What are the signs or symptoms? The main symptom of a bunion is a noticeable bump on the big toe. Other symptoms may include:  Pain.  Swelling around the big toe.  Redness and inflammation.  Thick or hardened skin on the big toe or between the toes.  Stiffness or loss of motion in the big toe.  Trouble with walking. How is this diagnosed? A bunion may be diagnosed based on your symptoms, medical history, and activities. You may have tests, such as:  X-rays. These allow your health care provider to check the position of the bones in your foot and look for damage to your joint. They also help your health care provider determine the severity of your bunion and the best way to treat it.  Joint aspiration. In this test, a sample of fluid is removed from the toe joint. This test may be done if you are in a lot of pain. It helps rule out diseases that cause painful swelling of the joints, such as arthritis. How is this  treated? Treatment depends on the severity of your symptoms. The goal of treatment is to relieve symptoms and prevent the bunion from getting worse. Your health care provider may recommend:  Wearing shoes that have a wide toe box.  Using bunion pads to cushion the affected area.  Taping your toes together to keep them in a normal position.  Placing a device inside your shoe (orthotics) to help reduce pressure on your toe joint.  Taking medicine to ease pain, inflammation, and swelling.  Applying heat or ice to the affected area.  Doing stretching exercises.  Surgery to remove scar tissue and move the toes back into their normal position. This treatment is rare. Follow these instructions at home: Managing pain, stiffness, and swelling   If directed, put ice on the painful area: ? Put ice in a plastic bag. ? Place a towel between your skin and the bag. ? Leave the ice on for 20 minutes, 2-3 times a day. Activity   If directed, apply heat to the affected area before you exercise. Use the heat source that your health care provider recommends, such as a moist heat pack or a heating pad. ? Place a towel between your skin and the heat source. ? Leave the heat on for 20-30 minutes. ? Remove the heat if your skin turns bright red. This is especially important if you are unable to feel pain,   heat, or cold. You may have a greater risk of getting burned.  Do exercises as told by your health care provider. General instructions  Support your toe joint with proper footwear, shoe padding, or taping as told by your health care provider.  Take over-the-counter and prescription medicines only as told by your health care provider.  Keep all follow-up visits as told by your health care provider. This is important. Contact a health care provider if your symptoms:  Get worse.  Do not improve in 2 weeks. Get help right away if you have:  Severe pain and trouble with walking. Summary  A  bunion is a bump on the base of the big toe that forms when the bones of the big toe joint move out of position.  Bunions can make walking painful.  Treatment depends on the severity of your symptoms.  Support your toe joint with proper footwear, shoe padding, or taping as told by your health care provider. This information is not intended to replace advice given to you by your health care provider. Make sure you discuss any questions you have with your health care provider. Document Revised: 08/13/2017 Document Reviewed: 06/19/2017 Elsevier Patient Education  2020 [employer]  

## 2019-07-03 NOTE — Progress Notes (Signed)
Subjective:   Patient ID: Judith Stewart, female   DOB: 54 y.o.   MRN: [id number]   HPI 54 year old female presents the office with concerns of bunion pain to both feet with the left side worse than the right.  She states that she has noticed her toes during appointment as well.  Hurts with and without shoes.  She has tried shoe modifications with any significant improvement.  She was discussed other treatment options at this time.   Review of Systems  All other systems reviewed and are negative.  Past Medical History:  Diagnosis Date  . Deafness in left ear   . Partial deafness of right ear    hearing aid for right ear   . Thyroid disease     Past Surgical History:  Procedure Laterality Date  . CESAREAN SECTION    . IVF    . laprascopic surgery       Current Outpatient Medications:  .  levothyroxine (SYNTHROID, LEVOTHROID) 175 MCG tablet, Take 175 mcg by mouth daily before breakfast., Disp: , Rfl:   Current Facility-Administered Medications:  .  0.9 %  sodium chloride infusion, 500 mL, Intravenous, Continuous, Irene Shipper, MD  No Known Allergies       Objective:  Physical Exam  General: AAO x3, NAD  Dermatological: Mild erythema along the bunion sites from irritation.  There is no open sore, warmth or any signs of infection.  Vascular: Dorsalis Pedis artery and Posterior Tibial artery pedal pulses are 2/4 bilateral with immedate capillary fill time. There is no pain with calf compression, swelling, warmth, erythema.   Neruologic: Grossly intact via light touch bilateral. Vibratory intact via tuning fork bilateral. Protective threshold with Semmes Wienstein monofilament intact to all pedal sites bilateral. Patellar and Achilles deep tendon reflexes 2+ bilateral. No Babinski or clonus noted bilateral.   Musculoskeletal: Severe bunion deformity is present on the right side worse than left.  There is hypermobility present to pressure.  There is no pain or crepitation or  restriction of first MPJ range of motion.  No other areas of discomfort identified.  Muscular strength 5/5 in all groups tested bilateral.  Gait: Unassisted, Nonantalgic.       Assessment:   Bunion deformity right side worse than left     Plan:  -Treatment options discussed including all alternatives, risks, and complications -Etiology of symptoms were discussed -X-rays were obtained and reviewed with the patient.  Severe bunion deformities present on the left side worse than right.  There is no evidence of acute fracture.  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 -We discussed both conservative as well as surgical treatment options.  At this time she is in continue with shoe modification, offloading and padding.  She was to consider surgical intervention.  Discussed left foot Lapidus bunionectomy.  We will check insurance and she is going to discuss with her husband.  We discussed the surgery as well as postoperative course we discussed all alternatives, risks, complications today.  Trula Slade DPM

## 2020-01-22 ENCOUNTER — Other Ambulatory Visit: Payer: Self-pay | Admitting: Obstetrics and Gynecology

## 2020-01-22 DIAGNOSIS — R928 Other abnormal and inconclusive findings on diagnostic imaging of breast: Secondary | ICD-10-CM

## 2020-02-19 ENCOUNTER — Other Ambulatory Visit: Payer: Self-pay | Admitting: Obstetrics and Gynecology

## 2020-02-19 ENCOUNTER — Ambulatory Visit
Admission: RE | Admit: 2020-02-19 | Discharge: 2020-02-19 | Disposition: A | Discharge status: Home / Self Care | Payer: No Typology Code available for payment source | Source: Ambulatory Visit | Attending: Obstetrics and Gynecology | Admitting: Obstetrics and Gynecology

## 2020-02-19 ENCOUNTER — Other Ambulatory Visit: Payer: Self-pay

## 2020-02-19 DIAGNOSIS — R928 Other abnormal and inconclusive findings on diagnostic imaging of breast: Secondary | ICD-10-CM

## 2020-02-19 DIAGNOSIS — R921 Mammographic calcification found on diagnostic imaging of breast: Secondary | ICD-10-CM

## 2020-03-01 ENCOUNTER — Ambulatory Visit
Admission: RE | Admit: 2020-03-01 | Discharge: 2020-03-01 | Disposition: A | Discharge status: Home / Self Care | Payer: No Typology Code available for payment source | Source: Ambulatory Visit | Attending: Obstetrics and Gynecology | Admitting: Obstetrics and Gynecology

## 2020-03-01 ENCOUNTER — Other Ambulatory Visit: Payer: Self-pay

## 2020-03-01 DIAGNOSIS — R921 Mammographic calcification found on diagnostic imaging of breast: Secondary | ICD-10-CM

## 2020-09-03 ENCOUNTER — Other Ambulatory Visit: Payer: Self-pay | Admitting: General Surgery

## 2020-09-03 DIAGNOSIS — Z09 Encounter for follow-up examination after completed treatment for conditions other than malignant neoplasm: Secondary | ICD-10-CM

## 2020-10-05 ENCOUNTER — Other Ambulatory Visit: Payer: Self-pay | Admitting: General Surgery

## 2020-10-05 DIAGNOSIS — N6489 Other specified disorders of breast: Secondary | ICD-10-CM

## 2020-10-08 ENCOUNTER — Other Ambulatory Visit: Payer: Self-pay

## 2020-10-08 ENCOUNTER — Ambulatory Visit
Admission: RE | Admit: 2020-10-08 | Discharge: 2020-10-08 | Disposition: A | Discharge status: Home / Self Care | Payer: No Typology Code available for payment source | Source: Ambulatory Visit | Attending: General Surgery | Admitting: General Surgery

## 2020-10-08 ENCOUNTER — Other Ambulatory Visit: Payer: Self-pay | Admitting: General Surgery

## 2020-10-08 DIAGNOSIS — N6489 Other specified disorders of breast: Secondary | ICD-10-CM

## 2021-04-12 ENCOUNTER — Ambulatory Visit
Admission: RE | Admit: 2021-04-12 | Discharge: 2021-04-12 | Disposition: A | Discharge status: Home / Self Care | Payer: BC Managed Care – PPO | Source: Ambulatory Visit | Attending: General Surgery | Admitting: General Surgery

## 2021-04-12 DIAGNOSIS — R922 Inconclusive mammogram: Secondary | ICD-10-CM | POA: Diagnosis not present

## 2021-04-12 DIAGNOSIS — N6489 Other specified disorders of breast: Secondary | ICD-10-CM

## 2021-04-19 DIAGNOSIS — L814 Other melanin hyperpigmentation: Secondary | ICD-10-CM | POA: Diagnosis not present

## 2021-04-19 DIAGNOSIS — Z872 Personal history of diseases of the skin and subcutaneous tissue: Secondary | ICD-10-CM | POA: Diagnosis not present

## 2021-04-19 DIAGNOSIS — L821 Other seborrheic keratosis: Secondary | ICD-10-CM | POA: Diagnosis not present

## 2021-04-19 DIAGNOSIS — D2372 Other benign neoplasm of skin of left lower limb, including hip: Secondary | ICD-10-CM | POA: Diagnosis not present

## 2021-05-09 DIAGNOSIS — R739 Hyperglycemia, unspecified: Secondary | ICD-10-CM | POA: Diagnosis not present

## 2021-05-09 DIAGNOSIS — E785 Hyperlipidemia, unspecified: Secondary | ICD-10-CM | POA: Diagnosis not present

## 2021-05-09 DIAGNOSIS — E039 Hypothyroidism, unspecified: Secondary | ICD-10-CM | POA: Diagnosis not present

## 2021-05-12 IMAGING — MG MM BREAST LOCALIZATION CLIP
4 series · 4 of 12 positions shown · non-contrast
Comparison: Previous exam(s).

CLINICAL DATA: Status post stereotactic guided core needle biopsy
of a 2 mm group of calcifications in the inferior right breast.

EXAM:
DIAGNOSTIC RIGHT MAMMOGRAM POST STEREOTACTIC BIOPSY

[R CC synth-2D]
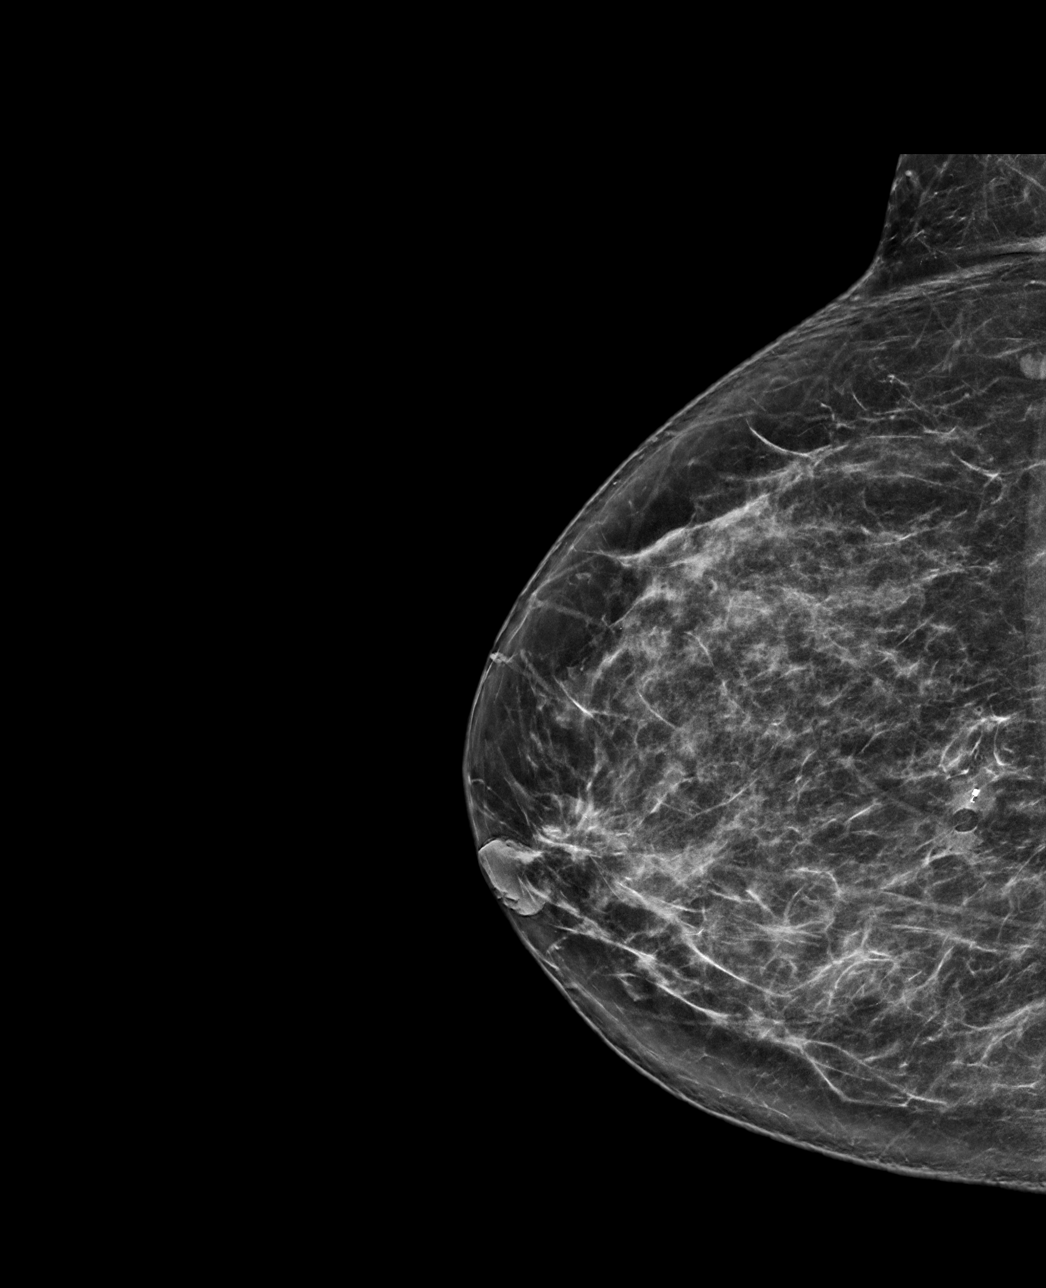

[R LM synth-2D]
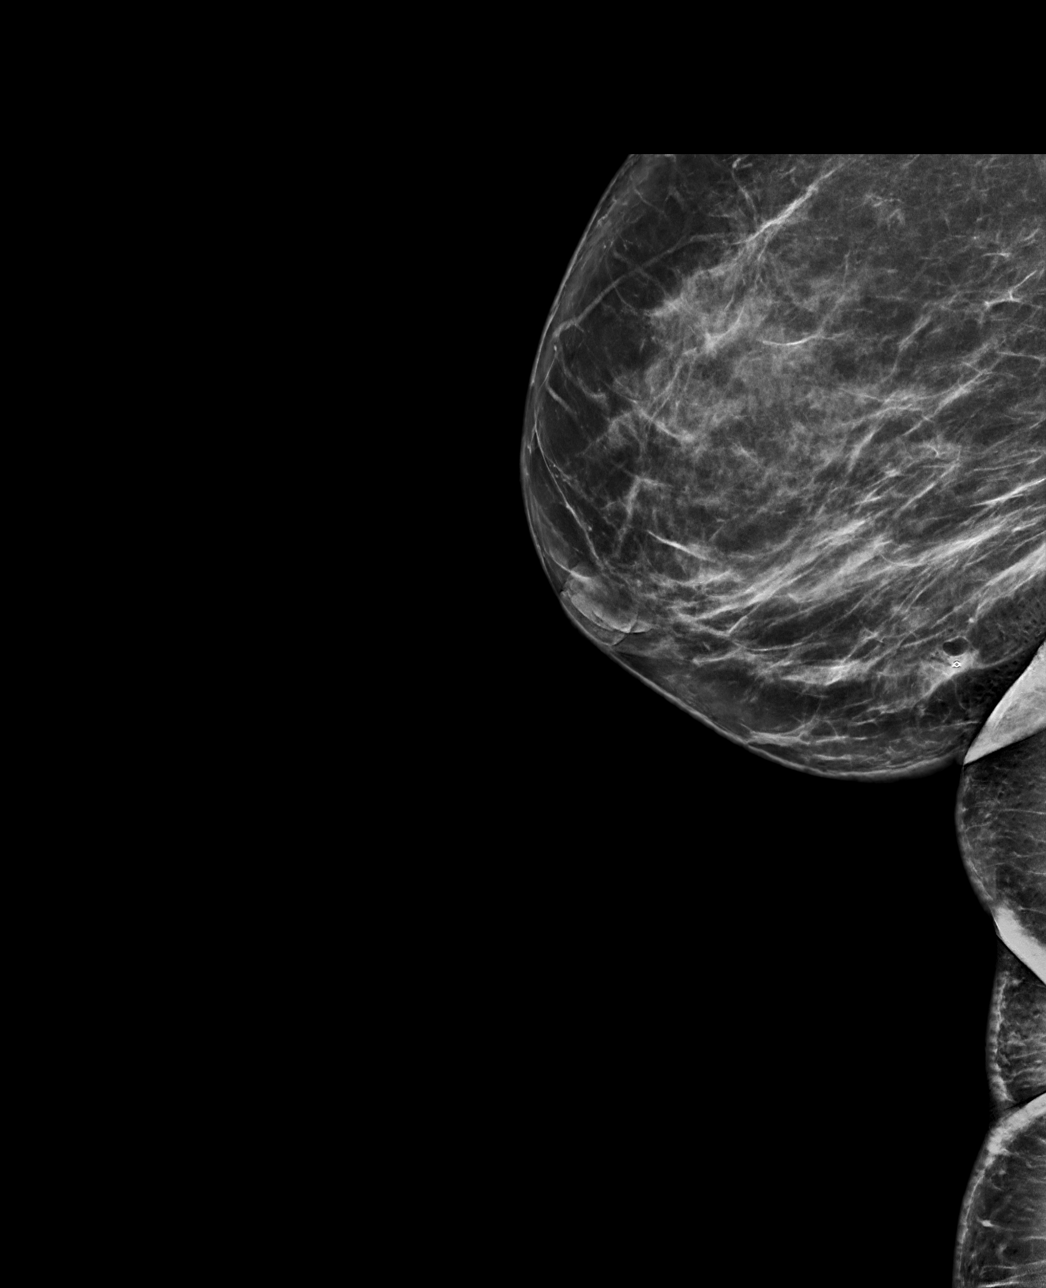

[R LM tomo · tomo slice 46/91.0]
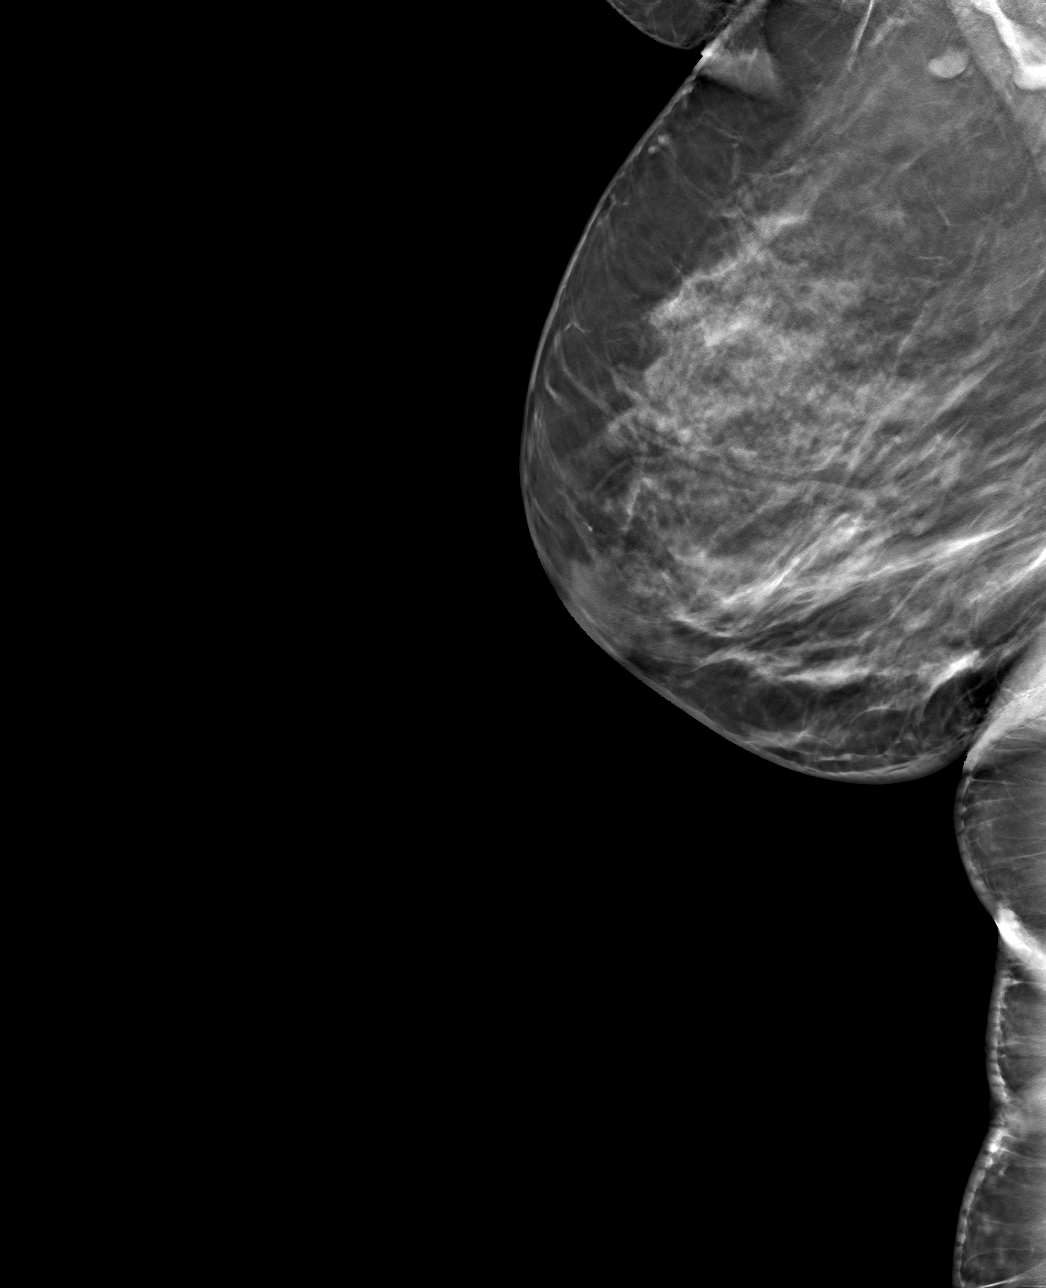

[R CC tomo · tomo slice 39/77.0]
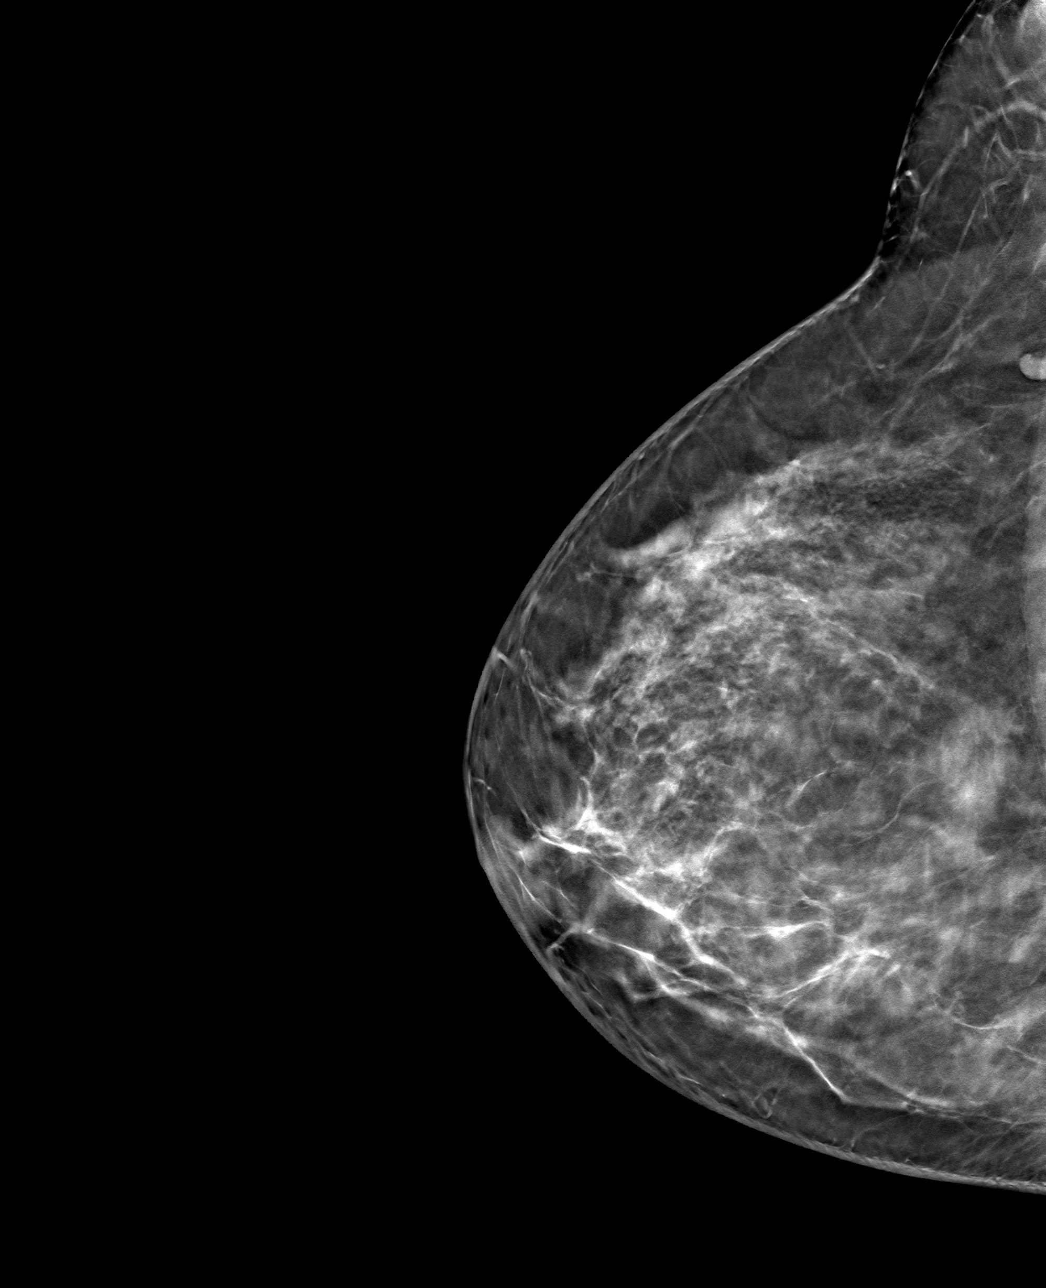

[4 of 12 positions shown; findings below may reference images not displayed]

FINDINGS: Mammographic images were obtained following stereotactic guided
biopsy of the recently demonstrated 2 mm group of calcifications in
the inferior right breast. The biopsy marking clip is in expected
position at the site of biopsy. No residual calcifications are seen
following biopsy.
IMPRESSION: Appropriate positioning of the coil shaped biopsy marking clip at
the site of biopsy in the inferior right breast.

Final Assessment: Post Procedure Mammograms for Marker Placement

## 2021-05-16 DIAGNOSIS — Z1331 Encounter for screening for depression: Secondary | ICD-10-CM | POA: Diagnosis not present

## 2021-05-16 DIAGNOSIS — Z0000 Encounter for general adult medical examination without abnormal findings: Secondary | ICD-10-CM | POA: Diagnosis not present

## 2021-05-16 DIAGNOSIS — R82998 Other abnormal findings in urine: Secondary | ICD-10-CM | POA: Diagnosis not present

## 2021-05-16 DIAGNOSIS — E039 Hypothyroidism, unspecified: Secondary | ICD-10-CM | POA: Diagnosis not present

## 2021-08-04 ENCOUNTER — Encounter: Payer: Self-pay | Admitting: Internal Medicine

## 2021-09-15 ENCOUNTER — Encounter: Payer: Self-pay | Admitting: Internal Medicine

## 2021-10-11 ENCOUNTER — Ambulatory Visit (AMBULATORY_SURGERY_CENTER): Payer: Self-pay

## 2021-10-11 VITALS — Ht 66.5 in | Wt 181.0 lb

## 2021-10-11 DIAGNOSIS — Z86010 Personal history of colonic polyps: Secondary | ICD-10-CM

## 2021-10-11 MED ORDER — NA SULFATE-K SULFATE-MG SULF 17.5-3.13-1.6 GM/177ML PO SOLN: 1.0000 | ORAL | 0 refills | Status: AC

## 2021-10-11 NOTE — Progress Notes (Signed)
No egg or soy allergy known to patient  No issues known to pt with past sedation with any surgeries or procedures Patient denies ever being told they had issues or difficulty with intubation  No FH of Malignant Hyperthermia Pt is not on diet pills Pt is not on  home 02  Pt is not on blood thinners  Pt denies issues with constipation  No A fib or A flutter Have any cardiac testing pending--denied Pt instructed to use Single[employer] or GoodRx for a price reduction on prep   

## 2021-10-17 ENCOUNTER — Encounter: Payer: Self-pay | Admitting: Internal Medicine

## 2021-11-01 ENCOUNTER — Encounter: Payer: Self-pay | Admitting: Internal Medicine

## 2021-11-01 ENCOUNTER — Ambulatory Visit (AMBULATORY_SURGERY_CENTER): Payer: BC Managed Care – PPO | Admitting: Internal Medicine

## 2021-11-01 VITALS — BP 115/65 | HR 70 | Temp 97.7°F | Resp 14 | Ht 66.5 in | Wt 181.0 lb

## 2021-11-01 DIAGNOSIS — Z09 Encounter for follow-up examination after completed treatment for conditions other than malignant neoplasm: Secondary | ICD-10-CM | POA: Diagnosis not present

## 2021-11-01 DIAGNOSIS — D125 Benign neoplasm of sigmoid colon: Secondary | ICD-10-CM | POA: Diagnosis not present

## 2021-11-01 DIAGNOSIS — Z1211 Encounter for screening for malignant neoplasm of colon: Secondary | ICD-10-CM | POA: Diagnosis not present

## 2021-11-01 DIAGNOSIS — Z86010 Personal history of colonic polyps: Secondary | ICD-10-CM

## 2021-11-01 MED ORDER — SODIUM CHLORIDE 0.9 % IV SOLN: 500.0000 mL | INTRAVENOUS | Status: DC

## 2021-11-01 NOTE — Progress Notes (Signed)
Sedate, gd SR, tolerated procedure well, VSS, report to RN 

## 2021-11-01 NOTE — Progress Notes (Signed)
Called to room to assist during endoscopic procedure.  Patient ID and intended procedure confirmed with present staff. Received instructions for my participation in the procedure from the performing physician.  

## 2021-11-01 NOTE — Progress Notes (Signed)
HISTORY OF PRESENT ILLNESS:  Judith Stewart is a 56 y.o. female with a history of adenomatous colon polyps who presents today for surveillance colonoscopy.  No complaints  REVIEW OF SYSTEMS:  All non-GI ROS negative.  Past Medical History:  Diagnosis Date   Deafness in left ear    Partial deafness of right ear    hearing aid for right ear    Thyroid disease     Past Surgical History:  Procedure Laterality Date   CESAREAN SECTION     IVF     laprascopic surgery      Social History Judith Stewart  reports that she quit smoking about 28 years ago. Her smoking use included cigarettes. She has never used smokeless tobacco. She reports that she does not drink alcohol and does not use drugs.  family history includes Heart disease in her father.  No Known Allergies     PHYSICAL EXAMINATION: Vital signs: BP 131/72   Pulse 75   Temp 97.7 F (36.5 C) (Temporal)   Ht 5' 6.5" (1.689 m)   Wt 181 lb (82.1 kg)   BMI 28.78 kg/m  General: Well-developed, well-nourished, no acute distress HEENT: Sclerae are anicteric, conjunctiva pink. Oral mucosa intact Lungs: Clear Heart: Regular Abdomen: soft, nontender, nondistended, no obvious ascites, no peritoneal signs, normal bowel sounds. No organomegaly. Extremities: No edema Psychiatric: alert and oriented x3. Cooperative      ASSESSMENT:  History of adenomatous colon polyps   PLAN:  Surveillance colonoscopy

## 2021-11-01 NOTE — Op Note (Signed)
[facility] Patient Name: Judith Stewart Procedure Date: 11/01/2021 10:10 AM MRN: [id number] Endoscopist: Docia Chuck. Henrene Pastor , MD Age: 56 Referring MD:  Date of Birth: 1966-02-14 Gender: Female Account #: [id number] Procedure:                Colonoscopy with cold snare polypectomy x 1 Indications:              High risk colon cancer surveillance: Personal                            history of non-advanced adenomas (2). 2018 Medicines:                Monitored Anesthesia Care Procedure:                Pre-Anesthesia Assessment:                           - Prior to the procedure, a History and Physical                            was performed, and patient medications and                            allergies were reviewed. The patient's tolerance of                            previous anesthesia was also reviewed. The risks                            and benefits of the procedure and the sedation                            options and risks were discussed with the patient.                            All questions were answered, and informed consent                            was obtained. Prior Anticoagulants: The patient has                            taken no previous anticoagulant or antiplatelet                            agents. ASA Grade Assessment: II - A patient with                            mild systemic disease. After reviewing the risks                            and benefits, the patient was deemed in                            satisfactory condition to undergo the procedure.  After obtaining informed consent, the colonoscope                            was passed under direct vision. Throughout the                            procedure, the patient's blood pressure, pulse, and                            oxygen saturations were monitored continuously. The                            Olympus CF-HQ190L [id number]) Colonoscope was                             introduced through the anus and advanced to the the                            cecum, identified by appendiceal orifice and                            ileocecal valve. The ileocecal valve, appendiceal                            orifice, and rectum were photographed. The quality                            of the bowel preparation was excellent. The                            colonoscopy was performed without difficulty. The                            patient tolerated the procedure well. The bowel                            preparation used was SUPREP via split dose                            instruction. Scope In: 10:14:01 AM Scope Out: 10:28:18 AM Scope Withdrawal Time: 0 hours 9 minutes 24 seconds  Total Procedure Duration: 0 hours 14 minutes 17 seconds  Findings:                 A 3 mm polyp was found in the sigmoid colon. The                            polyp was removed with a cold snare. Resection and                            retrieval were complete.                           Internal hemorrhoids were found during retroflexion.  The exam was otherwise without abnormality on                            direct and retroflexion views. Complications:            No immediate complications. Estimated blood loss:                            None. Estimated Blood Loss:     Estimated blood loss: none. Impression:               - One 3 mm polyp in the sigmoid colon, removed with                            a cold snare. Resected and retrieved.                           - Internal hemorrhoids.                           - The examination was otherwise normal on direct                            and retroflexion views. Recommendation:           - Repeat colonoscopy in 5 years if polyp                            adenomatous, otherwise 10 years for surveillance.                           - Patient has a contact number available for                             emergencies. The signs and symptoms of potential                            delayed complications were discussed with the                            patient. Return to normal activities tomorrow.                            Written discharge instructions were provided to the                            patient.                           - Resume previous diet.                           - Continue present medications.                           - Await pathology results. Docia Chuck. Henrene Pastor, MD 11/01/2021 10:32:30 AM This report has been signed electronically.

## 2021-11-01 NOTE — Patient Instructions (Signed)
-   Repeat colonoscopy in 5 years if polyp adenomatous, otherwise 10 years for surveillance.  - Patient has a contact number available for emergencies. The signs and symptoms of potential delayed complications were discussed with the patient. Return to normal activities tomorrow. Written discharge instructions were provided to the patient.  - Resume previous diet.  - Continue present medications.  - Await pathology results.  YOU HAD AN ENDOSCOPIC PROCEDURE TODAY AT [facility]:   Refer to the procedure report that was given to you for any specific questions about what was found during the examination.  If the procedure report does not answer your questions, please call your gastroenterologist to clarify.  If you requested that your care partner not be given the details of your procedure findings, then the procedure report has been included in a sealed envelope for you to review at your convenience later.  YOU SHOULD EXPECT: Some feelings of bloating in the abdomen. Passage of more gas than usual.  Walking can help get rid of the air that was put into your GI tract during the procedure and reduce the bloating. If you had a lower endoscopy (such as a colonoscopy or flexible sigmoidoscopy) you may notice spotting of blood in your stool or on the toilet paper. If you underwent a bowel prep for your procedure, you may not have a normal bowel movement for a few days.  Please Note:  You might notice some irritation and congestion in your nose or some drainage.  This is from the oxygen used during your procedure.  There is no need for concern and it should clear up in a day or so.  SYMPTOMS TO REPORT IMMEDIATELY:  Following lower endoscopy (colonoscopy or flexible sigmoidoscopy):  Excessive amounts of blood in the stool  Significant tenderness or worsening of abdominal pains  Swelling of the abdomen that is new, acute  Fever of 100F or higher  For urgent or emergent issues, a  gastroenterologist can be reached at any hour by calling [phone]. Do not use MyChart messaging for urgent concerns.    DIET:  We do recommend a small meal at first, but then you may proceed to your regular diet.  Drink plenty of fluids but you should avoid alcoholic beverages for 24 hours.  ACTIVITY:  You should plan to take it easy for the rest of today and you should NOT DRIVE or use heavy machinery until tomorrow (because of the sedation medicines used during the test).    FOLLOW UP: Our staff will call the number listed on your records the next business day following your procedure.  We will call around 7:15- 8:00 am to check on you and address any questions or concerns that you may have regarding the information given to you following your procedure. If we do not reach you, we will leave a message.     If any biopsies were taken you will be contacted by phone or by letter within the next 1-3 weeks.  Please call us at [phone] if you have not heard about the biopsies in 3 weeks.    SIGNATURES/CONFIDENTIALITY: You and/or your care partner have signed paperwork which will be entered into your electronic medical record.  These signatures attest to the fact that that the information above on your After Visit Summary has been reviewed and is understood.  Full responsibility of the confidentiality of this discharge information lies with you and/or your care-partner.

## 2021-11-01 NOTE — Progress Notes (Signed)
Pt's states no medical or surgical changes since previsit or office visit. 

## 2021-11-02 ENCOUNTER — Telehealth: Payer: Self-pay

## 2021-11-02 NOTE — Telephone Encounter (Signed)
  Follow up Call-     11/01/2021    9:18 AM  Call back number  Post procedure Call Back phone  # [phone]  Permission to leave phone message Yes     Patient questions:  Do you have a fever, pain , or abdominal swelling? No. Pain Score  0 *  Have you tolerated food without any problems? Yes.    Have you been able to return to your normal activities? Yes.    Do you have any questions about your discharge instructions: Diet   No. Medications  No. Follow up visit  No.  Do you have questions or concerns about your Care? No.  Actions: * If pain score is 4 or above: No action needed, pain <4.

## 2021-11-03 ENCOUNTER — Encounter: Payer: Self-pay | Admitting: Internal Medicine

## 2021-11-24 DIAGNOSIS — E039 Hypothyroidism, unspecified: Secondary | ICD-10-CM | POA: Diagnosis not present

## 2021-11-24 DIAGNOSIS — Z23 Encounter for immunization: Secondary | ICD-10-CM | POA: Diagnosis not present

## 2021-11-24 DIAGNOSIS — E785 Hyperlipidemia, unspecified: Secondary | ICD-10-CM | POA: Diagnosis not present

## 2021-11-24 DIAGNOSIS — R739 Hyperglycemia, unspecified: Secondary | ICD-10-CM | POA: Diagnosis not present

## 2021-11-29 ENCOUNTER — Ambulatory Visit (INDEPENDENT_AMBULATORY_CARE_PROVIDER_SITE_OTHER): Payer: BC Managed Care – PPO | Admitting: Podiatry

## 2021-11-29 ENCOUNTER — Ambulatory Visit (INDEPENDENT_AMBULATORY_CARE_PROVIDER_SITE_OTHER): Payer: BC Managed Care – PPO

## 2021-11-29 DIAGNOSIS — M21619 Bunion of unspecified foot: Secondary | ICD-10-CM

## 2021-11-29 DIAGNOSIS — M19072 Primary osteoarthritis, left ankle and foot: Secondary | ICD-10-CM

## 2021-11-29 DIAGNOSIS — M21612 Bunion of left foot: Secondary | ICD-10-CM

## 2021-11-29 MED ORDER — MELOXICAM 15 MG PO TABS: 15.0000 mg | ORAL_TABLET | Freq: Every day | ORAL | 0 refills | Status: AC | PRN

## 2021-11-29 NOTE — Progress Notes (Signed)
Subjective: Chief Complaint  Patient presents with   Bunions    Left foot bunion pain, shooting sharp pain in the hallux and top of the left foot, rate of pain 10 out of 10 sometimes, X-Rays taken today    56 year old female presents for above concerns.  She states she is continue to have pain to her left foot along the bunion area but also the top of her foot along the Lisfranc joint.  She does get some occasional swelling to the area.  No redness.  No recent injury or trauma  Objective: AAO x3, NAD DP/PT pulses palpable bilaterally, CRT less than 3 seconds Significant bunions present on the left foot.  There is tenderness palpation of the dorsal Lisfranc joint.  Localized edema dorsal aspect of the midfoot.  There is no erythema or warmth.  MMT 5/5. 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  -X-rays were obtained and reviewed.  Severe bunion is present and there is arthritic changes present the Lisfranc joint.  No ends of acute fracture.-We discussed both conservative as well as surgical treatment options.  However given her new pain in the dorsal midfoot we discussed Lisfranc joint arthrodesis.  Lapidus however discussed possibly the second or third metatarsal cuneiform joint arthrodesis as well given her pain.  I will order CT for surgical planning prior to surgery. -Patient encouraged to call the office with any questions, concerns, change in symptoms.   Trula Slade DPM

## 2021-12-05 ENCOUNTER — Ambulatory Visit
Admission: RE | Admit: 2021-12-05 | Discharge: 2021-12-05 | Disposition: A | Discharge status: Home / Self Care | Payer: BC Managed Care – PPO | Source: Ambulatory Visit | Attending: Podiatry | Admitting: Podiatry

## 2021-12-05 DIAGNOSIS — G8929 Other chronic pain: Secondary | ICD-10-CM | POA: Diagnosis not present

## 2021-12-05 DIAGNOSIS — M19072 Primary osteoarthritis, left ankle and foot: Secondary | ICD-10-CM | POA: Diagnosis not present

## 2021-12-05 DIAGNOSIS — M2012 Hallux valgus (acquired), left foot: Secondary | ICD-10-CM | POA: Diagnosis not present

## 2021-12-05 DIAGNOSIS — M7732 Calcaneal spur, left foot: Secondary | ICD-10-CM | POA: Diagnosis not present

## 2021-12-12 ENCOUNTER — Ambulatory Visit (INDEPENDENT_AMBULATORY_CARE_PROVIDER_SITE_OTHER): Payer: BC Managed Care – PPO | Admitting: Podiatry

## 2021-12-12 DIAGNOSIS — M21619 Bunion of unspecified foot: Secondary | ICD-10-CM | POA: Diagnosis not present

## 2021-12-12 DIAGNOSIS — M19072 Primary osteoarthritis, left ankle and foot: Secondary | ICD-10-CM

## 2021-12-12 DIAGNOSIS — M1000 Idiopathic gout, unspecified site: Secondary | ICD-10-CM

## 2021-12-12 NOTE — Patient Instructions (Signed)
Pre-Operative Instructions  Congratulations, you have decided to take an important step to improving your quality of life.  You can be assured that the doctors of [facility] will be with you every step of the way.  Plan to be at the surgery center/hospital at least 1 (one) hour prior to your scheduled time unless otherwise directed by the surgical center/hospital staff.  You must have a responsible adult accompany you, remain during the surgery and drive you home.  Make sure you have directions to the surgical center/hospital and know how to get there on time. For hospital based surgery you will need to obtain a history and physical form from your family physician within 1 month prior to the date of surgery- we will give you a form for you primary physician.  We make every effort to accommodate the date you request for surgery.  There are however, times where surgery dates or times have to be moved.  We will contact you as soon as possible if a change in schedule is required.   No Aspirin/Ibuprofen for one week before surgery.  If you are on aspirin, any non-steroidal anti-inflammatory medications (Mobic, Aleve, Ibuprofen) you should stop taking it 7 days prior to your surgery.  You make take Tylenol  For pain prior to surgery.  Medications- If you are taking daily heart and blood pressure medications, seizure, reflux, allergy, asthma, anxiety, pain or diabetes medications, make sure the surgery center/hospital is aware before the day of surgery so they may notify you which medications to take or avoid the day of surgery. No food or drink after midnight the night before surgery unless directed otherwise by surgical center/hospital staff. No alcoholic beverages 24 hours prior to surgery.  No smoking 24 hours prior to or 24 hours after surgery. Wear loose pants or shorts- loose enough to fit over bandages, boots, and casts. No slip on shoes, sneakers are best. Bring your boot with you to the  surgery center/hospital.  Also bring crutches or a walker if your physician has prescribed it for you.  If you do not have this equipment, it will be provided for you after surgery. If you have not been contracted by the surgery center/hospital by the day before your surgery, call to confirm the date and time of your surgery. Leave-time from work may vary depending on the type of surgery you have.  Appropriate arrangements should be made prior to surgery with your employer. Prescriptions will be provided immediately following surgery by your doctor.  Have these filled as soon as possible after surgery and take the medication as directed. Remove nail polish on the operative foot. Wash the night before surgery.  The night before surgery wash the foot and leg well with the antibacterial soap provided and water paying special attention to beneath the toenails and in between the toes.  Rinse thoroughly with water and dry well with a towel.  Perform this wash unless told not to do so by your physician.  Enclosed: 1 Ice pack (please put in freezer the night before surgery)   1 Hibiclens skin cleaner   Pre-op Instructions  If you have any questions regarding the instructions, do not hesitate to call our office at any point during this process.   [place]: [address] [place], [place] [address] [phone]  [place]: [address] [place], [place] [address] [phone]  Dr. Valmore Arabie, DPM  

## 2021-12-12 NOTE — Progress Notes (Unsigned)
Subjective: Chief Complaint  Patient presents with   Bunions    Left foot bunion pain, Surgical consult,    56 year old female presents for above concerns.  She presents today to further discuss surgery as well as repeat CT scan.  Overall symptoms are unchanged.  Objective: AAO x3, NAD DP/PT pulses palpable bilaterally, CRT less than 3 seconds Significant bunion present on the left foot there is tenderness on the dorsal aspect of the midfoot along Lisfranc joint.  This is most on the second third metatarsal cuneiform joint.  There is no crepitation with range of motion.  Tenderness on the bunion eminence.  MMT 5/5. 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  -I reviewed the CT scan with her.  It showed moderate changes of the first metatarsophalangeal joint.  We discussed surgery as well as conservative care.  We discussed different surgical options.  Discussed with her ultimately first MPJ arthrodesis versus Lapidus bunionectomy will be determined intraoperatively as well second, third metatarsal cuneiform joint arthrodesis. -We will check a uric acid, vitamin D level prior to surgery -Surgery scheduled for 12/28/2021 -The incision placement as well as the postoperative course was discussed with the patient. I discussed risks of the surgery which include, but not limited to, infection, bleeding, pain, swelling, need for further surgery, delayed or nonhealing, painful or ugly scar, numbness or sensation changes, over/under correction, recurrence, transfer lesions, further deformity, hardware failure, DVT/PE, loss of toe/foot. Patient understands these risks and wishes to proceed with surgery. The surgical consent was reviewed with the patient all 3 pages were signed. No promises or guarantees were given to the  outcome of the procedure. All questions were answered to the best of my ability. Before the surgery the patient was encouraged to call the office if there is any further questions. The surgery will be performed at the [facility] on an outpatient basis. -Knee scooter ordered for postop use.  -Patient encouraged to call the office with any questions, concerns, change in symptoms.   35 minutes were spent with the patient and coordinating care/surgery with greater than 50% of time in face-to-face contact.  Trula Slade DPM

## 2021-12-13 ENCOUNTER — Telehealth: Payer: Self-pay | Admitting: Podiatry

## 2021-12-13 NOTE — Telephone Encounter (Signed)
DOS: 12/28/2021  Dr. Loel Lofty Assisting  [employer]  Procedures: Hallux MPJ Fusion Lt [phone]) and Arthrodesis Aniceto Boss Lt [phone])  DX: M21.619 and M19.072  Deductible: $3,000 with $0 remaining Out-of-Pocket: $10,000 with $655.68 remaining CoInsurance: 20% No Copay  Prior Authorization is Not Required per [employer].   Call Reference #: [id number]

## 2021-12-14 ENCOUNTER — Telehealth: Payer: Self-pay

## 2021-12-14 DIAGNOSIS — E559 Vitamin D deficiency, unspecified: Secondary | ICD-10-CM | POA: Diagnosis not present

## 2021-12-14 DIAGNOSIS — M21619 Bunion of unspecified foot: Secondary | ICD-10-CM | POA: Diagnosis not present

## 2021-12-14 DIAGNOSIS — M19072 Primary osteoarthritis, left ankle and foot: Secondary | ICD-10-CM | POA: Diagnosis not present

## 2021-12-14 DIAGNOSIS — M1000 Idiopathic gout, unspecified site: Secondary | ICD-10-CM | POA: Diagnosis not present

## 2021-12-14 NOTE — Telephone Encounter (Signed)
Order for knee scooter placed with [employer], patient notified.

## 2021-12-15 ENCOUNTER — Other Ambulatory Visit: Payer: Self-pay | Admitting: Podiatry

## 2021-12-15 LAB — CBC WITH DIFFERENTIAL/PLATELET
Absolute Monocytes: 442 cells/uL (ref 200–950)
Basophils Absolute: 38 cells/uL (ref 0–200)
Basophils Relative: 0.6 %
Eosinophils Absolute: 179 cells/uL (ref 15–500)
Eosinophils Relative: 2.8 %
HCT: 42.4 % (ref 35.0–45.0)
Hemoglobin: 13.6 g/dL (ref 11.7–15.5)
Lymphs Abs: 2950 cells/uL (ref 850–3900)
MCH: 28.8 pg (ref 27.0–33.0)
MCHC: 32.1 g/dL (ref 32.0–36.0)
MCV: 89.8 fL (ref 80.0–100.0)
MPV: 11.9 fL (ref 7.5–12.5)
Monocytes Relative: 6.9 %
Neutro Abs: 2790 cells/uL (ref 1500–7800)
Neutrophils Relative %: 43.6 %
Platelets: 207 10*3/uL (ref 140–400)
RBC: 4.72 10*6/uL (ref 3.80–5.10)
RDW: 12.9 % (ref 11.0–15.0)
Total Lymphocyte: 46.1 %
WBC: 6.4 10*3/uL (ref 3.8–10.8)

## 2021-12-15 LAB — BASIC METABOLIC PANEL
BUN: 14 mg/dL (ref 7–25)
CO2: 25 mmol/L (ref 20–32)
Calcium: 10.4 mg/dL (ref 8.6–10.4)
Chloride: 103 mmol/L (ref 98–110)
Creat: 0.69 mg/dL (ref 0.50–1.03)
Glucose, Bld: 84 mg/dL (ref 65–99)
Potassium: 4.8 mmol/L (ref 3.5–5.3)
Sodium: 141 mmol/L (ref 135–146)

## 2021-12-15 LAB — VITAMIN D 25 HYDROXY (VIT D DEFICIENCY, FRACTURES): Vit D, 25-Hydroxy: 21 ng/mL — ABNORMAL LOW (ref 30–100)

## 2021-12-15 LAB — URIC ACID: Uric Acid, Serum: 4.8 mg/dL (ref 2.5–7.0)

## 2021-12-15 MED ORDER — VITAMIN D (ERGOCALCIFEROL) 1.25 MG (50000 UNIT) PO CAPS: 50000.0000 [IU] | ORAL_CAPSULE | ORAL | 0 refills | Status: DC

## 2021-12-16 ENCOUNTER — Encounter: Payer: Self-pay | Admitting: Podiatry

## 2021-12-28 ENCOUNTER — Other Ambulatory Visit: Payer: Self-pay | Admitting: Podiatry

## 2021-12-28 ENCOUNTER — Encounter: Payer: Self-pay | Admitting: Podiatry

## 2021-12-28 DIAGNOSIS — M19072 Primary osteoarthritis, left ankle and foot: Secondary | ICD-10-CM

## 2021-12-28 DIAGNOSIS — G8918 Other acute postprocedural pain: Secondary | ICD-10-CM | POA: Diagnosis not present

## 2021-12-28 DIAGNOSIS — M21612 Bunion of left foot: Secondary | ICD-10-CM | POA: Diagnosis not present

## 2021-12-28 MED ORDER — CEPHALEXIN 500 MG PO CAPS: 500.0000 mg | ORAL_CAPSULE | Freq: Three times a day (TID) | ORAL | 0 refills | Status: DC

## 2021-12-28 MED ORDER — OXYCODONE-ACETAMINOPHEN 10-325 MG PO TABS: 1.0000 | ORAL_TABLET | ORAL | 0 refills | Status: DC | PRN

## 2021-12-28 MED ORDER — PROMETHAZINE HCL 25 MG PO TABS: 25.0000 mg | ORAL_TABLET | Freq: Three times a day (TID) | ORAL | 0 refills | Status: AC | PRN

## 2021-12-28 NOTE — Progress Notes (Signed)
Postop medications sent 

## 2022-01-02 ENCOUNTER — Ambulatory Visit (INDEPENDENT_AMBULATORY_CARE_PROVIDER_SITE_OTHER): Payer: BC Managed Care – PPO

## 2022-01-02 ENCOUNTER — Ambulatory Visit (INDEPENDENT_AMBULATORY_CARE_PROVIDER_SITE_OTHER): Payer: BC Managed Care – PPO | Admitting: Podiatry

## 2022-01-02 DIAGNOSIS — Z9889 Other specified postprocedural states: Secondary | ICD-10-CM

## 2022-01-02 DIAGNOSIS — E039 Hypothyroidism, unspecified: Secondary | ICD-10-CM | POA: Diagnosis not present

## 2022-01-02 DIAGNOSIS — M19072 Primary osteoarthritis, left ankle and foot: Secondary | ICD-10-CM

## 2022-01-02 DIAGNOSIS — M21619 Bunion of unspecified foot: Secondary | ICD-10-CM

## 2022-01-02 MED ORDER — CEPHALEXIN 500 MG PO CAPS: 500.0000 mg | ORAL_CAPSULE | Freq: Three times a day (TID) | ORAL | 0 refills | Status: DC

## 2022-01-02 MED ORDER — OXYCODONE-ACETAMINOPHEN 10-325 MG PO TABS: 1.0000 | ORAL_TABLET | ORAL | 0 refills | Status: AC | PRN

## 2022-01-04 NOTE — Progress Notes (Signed)
Subjective: Chief Complaint  Patient presents with   Routine Post Op    Routine post op #1, patient denies any N/F/C/SOB, some swelling, patient has some pressure on the ball of the foot, rate of pain is a 10 out of 10, when not taking pain meds, TX cam boot and knee scooter, pain meds    56 year old female presents the office for above concerns.  She states that she still having pain but seems to be getting a little bit better she is decreased amount of pain medicine she has been taking.  She is been using the knee scooter.  Denies any fevers or chills.  No other concerns.  Objective: AAO x3, NAD Incisions are well coapted with sutures intact.  Minimal edema to the dorsal midfoot there is a majority of edema seems to be along the MPJs on the forefoot.  There is some slight erythema this is more from inflammation as opposed to infection.  There is no active bleeding or any signs of hematoma or fluid collection.  There is no drainage or pus noted otherwise.  Tenderness to palpation at surgical sites.  No pain with calf compression, swelling, warmth, erythema  Assessment: 55 year old female status post left foot first MPJ arthrodesis, midfoot arthrodesis  Plan: -All treatment options discussed with the patient including all alternatives, risks, complications.  -X-rays were obtained reviewed.  3 views left foot were obtained.  Hardware intact with any complicating factors.  Status post first MPJ as well as midfoot arthrodesis. -Incisions are healing well but still edema and erythema which I think is more from inflammation but due to the summer continue antibiotics.  I refilled her cephalexin.  Continue ice, elevate.  Antibiotic ointment and dressings applied.  Compression.  Continue nonweightbearing. -Monitor for any clinical signs or symptoms of infection and directed to call the office immediately should any occur or go to the ER. -Patient encouraged to call the office with any questions,  concerns, change in symptoms.   Trula Slade DPM

## 2022-01-07 ENCOUNTER — Other Ambulatory Visit: Payer: Self-pay | Admitting: Podiatry

## 2022-01-11 ENCOUNTER — Ambulatory Visit (INDEPENDENT_AMBULATORY_CARE_PROVIDER_SITE_OTHER): Payer: BC Managed Care – PPO

## 2022-01-11 DIAGNOSIS — Z9889 Other specified postprocedural states: Secondary | ICD-10-CM

## 2022-01-11 NOTE — Progress Notes (Signed)
Patient in the office for POV # 2 DOS 12/28/21 --- LEFT FOOT BUNIONECTOMY (FUSION OF 1ST MPJ VERSE LAPIDUS) FUSION OF 2ND AND 3RD MET CUNEIFORM JOINT.   Patient in the air fracture walker and using knee scooter as directed at today's visit. Patient denies nausea, vomiting, fever, chills and calf pain at this time. Patient states she does has some sharp pain in the left hallux at times and takes the Rx pain medication to relieve the discomfort. Patient also states she has some tingling feelings in the toes on the left foot. Patient also taking antibiotics as directed.   Sutures were removed without complication. Patient tolerated the removal well. Advised patient to continue to ice and elevate the foot and continue nonweightbearing. New DSD applied to surgical area.  Monitor for any clinical signs or symptoms of infection and directed to call the office immediately should any occur or go to the ER. Patient encouraged to call the office with any questions, concerns, change in symptoms.   Patient verbalized understanding. All questions answered during visit. 

## 2022-01-26 ENCOUNTER — Ambulatory Visit (INDEPENDENT_AMBULATORY_CARE_PROVIDER_SITE_OTHER): Payer: BC Managed Care – PPO | Admitting: Podiatry

## 2022-01-26 ENCOUNTER — Encounter: Payer: BC Managed Care – PPO | Admitting: Podiatry

## 2022-01-26 DIAGNOSIS — Z9889 Other specified postprocedural states: Secondary | ICD-10-CM

## 2022-01-26 DIAGNOSIS — M21619 Bunion of unspecified foot: Secondary | ICD-10-CM

## 2022-01-26 DIAGNOSIS — M19072 Primary osteoarthritis, left ankle and foot: Secondary | ICD-10-CM

## 2022-01-28 NOTE — Progress Notes (Signed)
Subjective: Chief Complaint  Patient presents with   Routine Post Op    POV# 3 bunion repair, some swelling and redness, patient denies any pain, no N/V/F/C/SOB     56 year old female presents the office for above concerns.  She states that she is doing better.  She denies any any significant pain and not taking any pain medication.  Still with some swelling.  Denies any fevers or chills.   Objective: AAO x3, NAD Incisions are well coapted with sutures intact.  There is still edema present but appears to be improved compared to when I last saw her.  Majority of the swelling is along the forefoot area.  She was having some erythema previously but this appears to be almost completely resolved.  There is still some faint erythema there is no increased temperature noted.  There is no drainage or pus coming from the incision.  The faint erythema think is more from inflammation as opposed to infection.  There is no fluctuation or crepitation.  No significant pain on exam. No pain with calf compression, swelling, warmth, erythema  Assessment: 56 year old female status post left foot first MPJ arthrodesis, midfoot arthrodesis  Plan: -All treatment options discussed with the patient including all alternatives, risks, complications.  -X-rays were obtained reviewed.  3 views left foot were obtained.  Hardware intact with any complicating factors.  Status post first MPJ as well as midfoot arthrodesis. -Incisions appear to be healing well.  Continue ice, elevate as well as compression help with the residual edema.  Continue nonweightbearing. -Pain medication as needed -Monitor for any clinical signs or symptoms of infection and directed to call the office immediately should any occur or go to the ER. -Patient encouraged to call the office with any questions, concerns, change in symptoms.   Trula Slade DPM

## 2022-02-06 DIAGNOSIS — Z01419 Encounter for gynecological examination (general) (routine) without abnormal findings: Secondary | ICD-10-CM | POA: Diagnosis not present

## 2022-02-07 ENCOUNTER — Ambulatory Visit (INDEPENDENT_AMBULATORY_CARE_PROVIDER_SITE_OTHER): Payer: BC Managed Care – PPO | Admitting: Podiatry

## 2022-02-07 ENCOUNTER — Ambulatory Visit (INDEPENDENT_AMBULATORY_CARE_PROVIDER_SITE_OTHER): Payer: BC Managed Care – PPO

## 2022-02-07 DIAGNOSIS — M21619 Bunion of unspecified foot: Secondary | ICD-10-CM

## 2022-02-07 DIAGNOSIS — M21612 Bunion of left foot: Secondary | ICD-10-CM

## 2022-02-07 DIAGNOSIS — Z9889 Other specified postprocedural states: Secondary | ICD-10-CM

## 2022-02-07 NOTE — Progress Notes (Signed)
Subjective: Chief Complaint  Patient presents with   Routine Post Op    POV # 4 DOS 12/28/21 --- LEFT FOOT BUNIONECTOMY (FUSION OF 1ST MPJ VERSE LAPIDUS) FUSION OF 2ND AND 3RD MET CUNEIFORM JOINT, X-RAYS TAKEN TODAY , TX: CAM BOOT KNEE SCOOTER      56 year old female presents the office for above concerns.  She states that she is having just a little bit of pain on scale 1/10.  She is more tenderness on the midfoot.  She does that she had a fall as she left the house on her own and she tripped over the lip coming in.  She was wearing the boot at the time.  She had some immediate pain but the pain did subside after short time.  No other injuries.  No fevers or chills that she reports.  No other concerns.  Objective: AAO x3, NAD Incisions are well coapted and scars are forming.  There is no evidence of dehiscence.  There is no surrounding erythema, ascending cellulitis.  There is no fluctuance or crepitation but there is no monitor.  Minimal edema.  Flexor, extensor tendons appear to be intact. No pain with calf compression, swelling, warmth, erythema  Assessment: 56 year old female status post left foot first MPJ arthrodesis, midfoot arthrodesis  Plan: -All treatment options discussed with the patient including all alternatives, risks, complications.  -X-rays were obtained reviewed.  3 views left foot were obtained.  Hardware intact with any complicating factors.  Does not appear there is any movement of the hardware from the fall.  Status post first MPJ as well as midfoot arthrodesis. -At this time continue cam boot discussed transition to partial weightbearing.  Continue ice, elevate as well as compression of any residual edema -Pain medication as needed -Monitor for any clinical signs or symptoms of infection and directed to call the office immediately should any occur or go to the ER. -Patient encouraged to call the office with any questions, concerns, change in symptoms.   Trula Slade DPM

## 2022-02-15 ENCOUNTER — Other Ambulatory Visit: Payer: Self-pay | Admitting: Podiatry

## 2022-02-15 DIAGNOSIS — Z9889 Other specified postprocedural states: Secondary | ICD-10-CM

## 2022-02-15 DIAGNOSIS — M21619 Bunion of unspecified foot: Secondary | ICD-10-CM

## 2022-02-27 ENCOUNTER — Other Ambulatory Visit: Payer: Self-pay | Admitting: Podiatry

## 2022-02-27 ENCOUNTER — Telehealth: Payer: Self-pay

## 2022-02-27 ENCOUNTER — Encounter: Payer: Self-pay | Admitting: Podiatry

## 2022-02-27 MED ORDER — CEPHALEXIN 500 MG PO CAPS: 500.0000 mg | ORAL_CAPSULE | Freq: Three times a day (TID) | ORAL | 0 refills | Status: AC

## 2022-02-27 NOTE — Telephone Encounter (Signed)
Patient will upload a picture to mychart.

## 2022-02-27 NOTE — Telephone Encounter (Signed)
Can we get her in Tuesday?

## 2022-02-28 ENCOUNTER — Ambulatory Visit (INDEPENDENT_AMBULATORY_CARE_PROVIDER_SITE_OTHER): Payer: BC Managed Care – PPO

## 2022-02-28 ENCOUNTER — Ambulatory Visit (INDEPENDENT_AMBULATORY_CARE_PROVIDER_SITE_OTHER): Payer: BC Managed Care – PPO | Admitting: Podiatry

## 2022-02-28 DIAGNOSIS — M19072 Primary osteoarthritis, left ankle and foot: Secondary | ICD-10-CM

## 2022-02-28 DIAGNOSIS — T8130XA Disruption of wound, unspecified, initial encounter: Secondary | ICD-10-CM

## 2022-02-28 DIAGNOSIS — L089 Local infection of the skin and subcutaneous tissue, unspecified: Secondary | ICD-10-CM

## 2022-02-28 NOTE — Progress Notes (Signed)
Subjective: Chief Complaint  Patient presents with   Follow-up    LEFT FOOT INFECTION POV #5 DOS 12/28/21 --- LEFT FOOT BUNIONECTOMY (FUSION OF 1ST MPJ VERSE LAPIDUS) FUSION OF 2ND AND 3RD MET CUNEIFORM JOINT     57 year old female presents the office for above concerns.  She states that the scab came off and she has had some swelling or redness.  She been keeping antibiotic ointment along the area.  She does not see any purulence.  She has started be partial weightbearing not sure if this is causing rubbing to the incision.  Denies any fevers or chills.  She has been in the cam boot.  She denies any fevers or chills.  Objective: AAO x3, NAD Incisions very well coapted except for on the hallux there is once area of skin breakdown.  There are some necrotic tissue present which is pictured below but on not able to probe hardware or visualize any hardware.  There are some fibrotic tissue present with hyperkeratotic periwound.  There is localized erythema to the area without any ascending cellulitis.  No fluctuance or crepitation.  There is no malodor. No pain with calf compression, swelling, warmth, erythema    Assessment: 57 year old female status post left foot first MPJ arthrodesis, midfoot arthrodesis; localized dehiscence  Plan: -All treatment options discussed with the patient including all alternatives, risks, complications.  -X-rays were obtained reviewed.  3 views left foot were obtained.  Hardware intact with any complicating factors.  Does not appear there is any movement of the hardware, no complicating factors postoperatively at this time. -I did sharply debride the wound with a #15 blade scalpel to remove fibrotic as well as necrotic tissue from the increased cremation tissue present.  There is no exposed or probe in the hardware.  I did apply Steri-Strip for reinforcement and I recommended Medihoney dressing changes daily which I applied and also dispensed.  She was sent over Keflex  last night which she started and she is noted to have finished the course of this.  Monitor closely for signs or symptoms of worsening erythema or infection. -Continue cam boot, nonweightbearing -She can use moisturizer on the other areas of the scarring but do not apply to the wound -Monitor for any clinical signs or symptoms of infection and directed to call the office immediately should any occur or go to the ER.  Return in about 1 week (around 03/07/2022).  Trula Slade DPM

## 2022-03-02 ENCOUNTER — Encounter: Payer: BC Managed Care – PPO | Admitting: Podiatry

## 2022-03-10 ENCOUNTER — Ambulatory Visit (INDEPENDENT_AMBULATORY_CARE_PROVIDER_SITE_OTHER): Payer: BC Managed Care – PPO | Admitting: Podiatry

## 2022-03-10 VITALS — BP 126/86 | HR 70

## 2022-03-10 DIAGNOSIS — T8130XA Disruption of wound, unspecified, initial encounter: Secondary | ICD-10-CM

## 2022-03-19 NOTE — Progress Notes (Signed)
Subjective: Chief Complaint  Patient presents with   Routine Post Op    POV #5 DOS 12/28/21 --- LEFT FOOT BUNIONECTOMY (FUSION OF 1ST MPJ VERSE LAPIDUS) FUSION OF 2ND AND 3RD MET CUNEIFORM JOINT     57 year old female presents the office for above concerns.  She presents today for follow-up evaluation of the wound.  She states that the redness and swelling is much improved and the wound is also improved.  She has been using Medihoney.  She does not see any drainage or pus.  No fevers or chills that she reports.  Pain is controlled.    Objective: AAO x3, NAD Along the area of the wound dehiscence there is increased granulation tissue present.  The wound is still open.  No probing to bone.  No exposed tendon or hardware.  Edema and erythema is present previously have resolved.  There is no fluctuance or crepitation but there is no malodor. No pain with calf compression, swelling, warmth, erythema    Assessment: 57 year old female status post left foot first MPJ arthrodesis, midfoot arthrodesis; localized dehiscence  Plan: -All treatment options discussed with the patient including all alternatives, risks, complications.  -I did clean the wound show any complications debrided from a small area of some necrotic tissue that was present but the wound is more granular and is going to end.  No signs of infection.  Continue with daily dressing changes, offloading. -Continue cam boot, nonweightbearing -She can use moisturizer on the other areas of the scarring but do not apply to the wound -Monitor for any clinical signs or symptoms of infection and directed to call the office immediately should any occur or go to the ER.  No follow-ups on file.  Trula Slade DPM

## 2022-03-21 ENCOUNTER — Ambulatory Visit (INDEPENDENT_AMBULATORY_CARE_PROVIDER_SITE_OTHER): Payer: BC Managed Care – PPO | Admitting: Podiatry

## 2022-03-21 DIAGNOSIS — M19072 Primary osteoarthritis, left ankle and foot: Secondary | ICD-10-CM

## 2022-03-21 DIAGNOSIS — D2261 Melanocytic nevi of right upper limb, including shoulder: Secondary | ICD-10-CM | POA: Diagnosis not present

## 2022-03-21 DIAGNOSIS — D2262 Melanocytic nevi of left upper limb, including shoulder: Secondary | ICD-10-CM | POA: Diagnosis not present

## 2022-03-21 DIAGNOSIS — D225 Melanocytic nevi of trunk: Secondary | ICD-10-CM | POA: Diagnosis not present

## 2022-03-21 DIAGNOSIS — L700 Acne vulgaris: Secondary | ICD-10-CM | POA: Diagnosis not present

## 2022-03-21 DIAGNOSIS — T8130XA Disruption of wound, unspecified, initial encounter: Secondary | ICD-10-CM

## 2022-03-21 MED ORDER — CEPHALEXIN 500 MG PO CAPS: 500.0000 mg | ORAL_CAPSULE | Freq: Three times a day (TID) | ORAL | 0 refills | Status: AC

## 2022-03-28 NOTE — Progress Notes (Signed)
Subjective: Chief Complaint  Patient presents with   Routine Post Op    POV #6 DOS 12/28/21 --- LEFT FOOT BUNIONECTOMY (FUSION OF 1ST MPJ VERSE LAPIDUS) FUSION OF 2ND AND 3RD MET CUNEIFORM JOINT.      57 year old female presents the office for above concerns.  Today for wound check.  States that she was using Medihoney, but it was getting infected so she switched back to Neosporin.  No drainage or pus that she reports.  Some localized swelling redness localized on the wound but no ascending cellulitis.  No fevers or chills.  Pain is controlled.     Objective: AAO x3, NAD Along the area of the wound dehiscence there is increased granulation tissue present.  There was some necrotic, scab tissue present I debrided today.  After debrided the wound there is no probing to bone, exposed bone or hardware or tendon.  Rim of erythema without any ascending cellulitis.  No fluctuance or crepitation there is no malodor.  Arthrodesis site appears to be stable. No pain with calf compression, swelling, warmth, erythema    Assessment: 57 year old female status post left foot first MPJ arthrodesis, midfoot arthrodesis; localized dehiscence  Plan: -All treatment options discussed with the patient including all alternatives, risks, complications.  -I did clean the wound and sharply debrided the ulcer without any complications debrided from a small area of some necrotic tissue that was present but the wound is more granular and is going to end.  No signs of infection.  Continue with daily dressing changes with antibiotic ointment, offloading. -Continue cam boot, nonweightbearing -Keflex -She can use moisturizer on the other areas of the scarring but do not apply to the wound -Monitor for any clinical signs or symptoms of infection and directed to call the office immediately should any occur or go to the ER.  No follow-ups on file.  Trula Slade DPM

## 2022-03-31 ENCOUNTER — Other Ambulatory Visit: Payer: Self-pay | Admitting: General Surgery

## 2022-03-31 DIAGNOSIS — N6489 Other specified disorders of breast: Secondary | ICD-10-CM

## 2022-04-02 ENCOUNTER — Other Ambulatory Visit: Payer: Self-pay | Admitting: Podiatry

## 2022-04-06 ENCOUNTER — Ambulatory Visit (INDEPENDENT_AMBULATORY_CARE_PROVIDER_SITE_OTHER): Payer: BC Managed Care – PPO

## 2022-04-06 ENCOUNTER — Ambulatory Visit (INDEPENDENT_AMBULATORY_CARE_PROVIDER_SITE_OTHER): Payer: BC Managed Care – PPO | Admitting: Podiatry

## 2022-04-06 DIAGNOSIS — M19072 Primary osteoarthritis, left ankle and foot: Secondary | ICD-10-CM

## 2022-04-06 DIAGNOSIS — Z9889 Other specified postprocedural states: Secondary | ICD-10-CM

## 2022-04-06 NOTE — Progress Notes (Signed)
Subjective: Chief Complaint  Patient presents with   Foot Problem    LEFT, FOLLOW UP FROM INFECTION, GREAT HALLUX A LITTLE SENSITIVE      57 year old female presents the office for above concerns.  She states that she is doing much better and the wound is much improved.  Currently no drainage or pus.  Some of the local sensitivity to it.  She does not report any fevers or chills.  She has no other concerns today.    Objective: AAO x3, NAD Along the area of the wound dehiscence there is increased granulation tissue present.  Wound is much smaller today.  Granular tissue is present there is no probing to bone, no tunneling.  No exposed bone or hardware or tendon.  There is no surrounding erythema, ascending cellulitis.  There is no fluctuance or crepitation.  No malodor.  Arthrodesis appears to be stable.  No pain with calf compression, swelling, warmth, erythema    Assessment: 57 year old female status post left foot first MPJ arthrodesis, midfoot arthrodesis; localized dehiscence  Plan: -All treatment options discussed with the patient including all alternatives, risks, complications.  -X-rays obtained reviewed.  3 views of the foot were obtained.  Hardware intact with any complicating factors.  Increased consolidation noted across the arthrodesis sites. -Overall the wound is doing much better.  She has been continuing daily dressing changes with small-moderate antibiotic ointment.  Discussed that she can start to transition to partial grade in the cam boot and then full weightbearing once the wound is completely healed.  Will start physical therapy.  Continue ice and elevation as well as compression. -Monitor for any clinical signs or symptoms of infection and directed to call the office immediately should any occur or go to the ER.  Return in about 2 weeks (around 04/20/2022) for post-op check, x-ray.  Trula Slade DPM

## 2022-04-13 ENCOUNTER — Other Ambulatory Visit: Payer: Self-pay | Admitting: Podiatry

## 2022-04-13 ENCOUNTER — Other Ambulatory Visit: Payer: Self-pay

## 2022-04-13 ENCOUNTER — Encounter: Payer: Self-pay | Admitting: Podiatry

## 2022-04-13 DIAGNOSIS — M19072 Primary osteoarthritis, left ankle and foot: Secondary | ICD-10-CM

## 2022-04-13 DIAGNOSIS — E559 Vitamin D deficiency, unspecified: Secondary | ICD-10-CM

## 2022-04-20 ENCOUNTER — Ambulatory Visit (INDEPENDENT_AMBULATORY_CARE_PROVIDER_SITE_OTHER): Payer: BC Managed Care – PPO

## 2022-04-20 ENCOUNTER — Ambulatory Visit (INDEPENDENT_AMBULATORY_CARE_PROVIDER_SITE_OTHER): Payer: BC Managed Care – PPO | Admitting: Podiatry

## 2022-04-20 DIAGNOSIS — Z9889 Other specified postprocedural states: Secondary | ICD-10-CM

## 2022-04-20 DIAGNOSIS — E559 Vitamin D deficiency, unspecified: Secondary | ICD-10-CM

## 2022-04-20 DIAGNOSIS — M19072 Primary osteoarthritis, left ankle and foot: Secondary | ICD-10-CM

## 2022-04-20 DIAGNOSIS — M79672 Pain in left foot: Secondary | ICD-10-CM

## 2022-04-22 NOTE — Progress Notes (Signed)
Subjective: Chief Complaint  Patient presents with   Routine Post Op    Post op of left foot. Patient denies any pain at this time. Using cam boot and crutches. Patient started physical therapy.      57 year old female presents the office for above concerns.  She went for her first physical therapy session today.  Everything went well.  She said the incision is healed and she does not see any drainage or pus.  She does not report any fevers or chills.  She has no other concerns today.  She has not had her repeat vitamin D checked.   Objective: AAO x3, NAD Sitting incision appears to be well-healed in particular distal portion incision where there was a superficial dehiscence this is healed as well.  There is no drainage or pus.  There is no fluctuance or crepitation.  There is no malodor.  Some slight edema still present. No pain with calf compression, swelling, warmth, erythema   Assessment: 57 year old female status post left foot first MPJ arthrodesis, midfoot arthrodesis; localized dehiscence which has since healed  Plan: -All treatment options discussed with the patient including all alternatives, risks, complications.  -X-rays obtained reviewed.  3 views of the foot were obtained.  Hardware intact with any complicating factors.  Increased consolidation noted across the arthrodesis sites.  There is no evidence of acute fracture. -Incision is healed she is starting physical therapy we can transition to weight-bear as tolerated in the cam boot.  She progresses with physical therapy with conservative transition to regular shoe as tolerated.  Continue ice, elevate as well as compression to help with residual edema. -She is going to get her vitamin D checked -Monitor for any clinical signs or symptoms of infection and directed to call the office immediately should any occur or go to the ER.  X-ray next appointment   Trula Slade DPM

## 2022-04-24 ENCOUNTER — Ambulatory Visit: Payer: BC Managed Care – PPO | Admitting: Podiatry

## 2022-04-26 DIAGNOSIS — M79672 Pain in left foot: Secondary | ICD-10-CM | POA: Diagnosis not present

## 2022-04-28 DIAGNOSIS — M79672 Pain in left foot: Secondary | ICD-10-CM | POA: Diagnosis not present

## 2022-05-01 DIAGNOSIS — M79672 Pain in left foot: Secondary | ICD-10-CM | POA: Diagnosis not present

## 2022-05-03 DIAGNOSIS — M79672 Pain in left foot: Secondary | ICD-10-CM | POA: Diagnosis not present

## 2022-05-08 DIAGNOSIS — M79672 Pain in left foot: Secondary | ICD-10-CM | POA: Diagnosis not present

## 2022-05-11 ENCOUNTER — Ambulatory Visit: Payer: Self-pay | Admitting: Podiatry

## 2022-05-22 DIAGNOSIS — E039 Hypothyroidism, unspecified: Secondary | ICD-10-CM | POA: Diagnosis not present

## 2022-05-22 DIAGNOSIS — E785 Hyperlipidemia, unspecified: Secondary | ICD-10-CM | POA: Diagnosis not present

## 2022-05-22 DIAGNOSIS — K769 Liver disease, unspecified: Secondary | ICD-10-CM | POA: Diagnosis not present

## 2022-05-22 DIAGNOSIS — R739 Hyperglycemia, unspecified: Secondary | ICD-10-CM | POA: Diagnosis not present

## 2022-05-24 DIAGNOSIS — M79672 Pain in left foot: Secondary | ICD-10-CM | POA: Diagnosis not present

## 2022-05-25 ENCOUNTER — Ambulatory Visit (INDEPENDENT_AMBULATORY_CARE_PROVIDER_SITE_OTHER): Payer: BC Managed Care – PPO

## 2022-05-25 ENCOUNTER — Ambulatory Visit: Payer: BC Managed Care – PPO | Admitting: Podiatry

## 2022-05-25 DIAGNOSIS — M19072 Primary osteoarthritis, left ankle and foot: Secondary | ICD-10-CM

## 2022-05-26 DIAGNOSIS — E039 Hypothyroidism, unspecified: Secondary | ICD-10-CM | POA: Diagnosis not present

## 2022-05-26 DIAGNOSIS — Z1331 Encounter for screening for depression: Secondary | ICD-10-CM | POA: Diagnosis not present

## 2022-05-26 DIAGNOSIS — R82998 Other abnormal findings in urine: Secondary | ICD-10-CM | POA: Diagnosis not present

## 2022-05-26 DIAGNOSIS — R030 Elevated blood-pressure reading, without diagnosis of hypertension: Secondary | ICD-10-CM | POA: Diagnosis not present

## 2022-05-26 DIAGNOSIS — M79672 Pain in left foot: Secondary | ICD-10-CM | POA: Diagnosis not present

## 2022-05-26 DIAGNOSIS — E785 Hyperlipidemia, unspecified: Secondary | ICD-10-CM | POA: Diagnosis not present

## 2022-05-26 DIAGNOSIS — Z0000 Encounter for general adult medical examination without abnormal findings: Secondary | ICD-10-CM | POA: Diagnosis not present

## 2022-05-26 DIAGNOSIS — E781 Pure hyperglyceridemia: Secondary | ICD-10-CM | POA: Diagnosis not present

## 2022-05-28 NOTE — Progress Notes (Signed)
Subjective: Chief Complaint  Patient presents with   Routine Post Op     57 year old female presents the office for above concerns.  She presents today walking in cam boot.  She has been doing physical therapy.  She is getting some discomfort on the ankle.  She denies any fevers or chills.  She has no other concerns today.  Objective: AAO x3, NAD Incisions well-healed no scar swelling.  No open lesions or dehiscence noted.  There is trace edema.  No erythema or warmth.  There is no area of pinpoint tenderness.  Mild discomfort on the ankle, midfoot but no specific area pinpoint tenderness.  Toes are rectus.  Arthrodesis site appears stable. No pain with calf compression, swelling, warmth, erythema   Assessment: 57 year old female status post left foot first MPJ arthrodesis, midfoot arthrodesis; localized dehiscence which has since healed  Plan: -All treatment options discussed with the patient including all alternatives, risks, complications.  -X-rays obtained reviewed.  3 views of the foot were obtained.  Hardware intact with any complicating factors.  Increased consolidation noted across the arthrodesis sites.  There is no evidence of acute fracture.  Osteopenia present. -Plan continue physical therapy continue with the cam boot for now but she can gradually start to transition to regular shoe as tolerated.  Continue ice and elevate as well as compression to help with any residual edema. -She is going to get her vitamin D checked -Monitor for any clinical signs or symptoms of infection and directed to call the office immediately should any occur or go to the ER.  X-ray next appointment   Vivi Barrack DPM

## 2022-05-29 DIAGNOSIS — M79672 Pain in left foot: Secondary | ICD-10-CM | POA: Diagnosis not present

## 2022-05-31 DIAGNOSIS — M79672 Pain in left foot: Secondary | ICD-10-CM | POA: Diagnosis not present

## 2022-06-07 DIAGNOSIS — M79672 Pain in left foot: Secondary | ICD-10-CM | POA: Diagnosis not present

## 2022-06-09 DIAGNOSIS — M79672 Pain in left foot: Secondary | ICD-10-CM | POA: Diagnosis not present

## 2022-06-12 DIAGNOSIS — M79672 Pain in left foot: Secondary | ICD-10-CM | POA: Diagnosis not present

## 2022-06-14 DIAGNOSIS — M79672 Pain in left foot: Secondary | ICD-10-CM | POA: Diagnosis not present

## 2022-06-21 DIAGNOSIS — M79672 Pain in left foot: Secondary | ICD-10-CM | POA: Diagnosis not present

## 2022-06-23 DIAGNOSIS — M79672 Pain in left foot: Secondary | ICD-10-CM | POA: Diagnosis not present

## 2022-06-26 DIAGNOSIS — M79672 Pain in left foot: Secondary | ICD-10-CM | POA: Diagnosis not present

## 2022-07-06 ENCOUNTER — Ambulatory Visit (INDEPENDENT_AMBULATORY_CARE_PROVIDER_SITE_OTHER): Payer: BC Managed Care – PPO

## 2022-07-06 ENCOUNTER — Ambulatory Visit: Payer: BC Managed Care – PPO | Admitting: Podiatry

## 2022-07-06 DIAGNOSIS — M19072 Primary osteoarthritis, left ankle and foot: Secondary | ICD-10-CM | POA: Diagnosis not present

## 2022-07-06 NOTE — Progress Notes (Signed)
Subjective: Chief Complaint  Patient presents with   Arthritis    Rm 11 MTP follow up of left foot. Pt states she is still having edema and her left foot feels a lot colder that the other foot. Pt states she finished PT and has not wore the boot for weeks.     57 year old female presents the office for above concerns.  She presents today walking in cam boot.  She has been doing physical therapy.  Overall she is feeling better.  She wears compression during the night.  No recent injury or changes.  No other concerns.  Objective: AAO x3, NAD-1 regular shoe Incisions well-healed scar is formed.  No open lesions or dehiscence noted.  There is trace edema.  No erythema or warmth.  There is no area of pinpoint tenderness.   Toes are rectus.  Arthrodesis site appears stable. No pain with calf compression, swelling, warmth, erythema   Assessment: 57 year old female status post left foot first MPJ arthrodesis, midfoot arthrodesis; localized dehiscence which has since healed  Plan: -All treatment options discussed with the patient including all alternatives, risks, complications.  -X-rays obtained reviewed.  3 views of the foot were obtained.  Hardware intact with any complicating factors.  Increased consolidation noted across the arthrodesis sites.  Osteopenia improved. -Overall improving.  Continue physical therapy and gradually increase activity as tolerated.  Continue ice, elevate as well as compression to help with the residual edema.  Discussed compression during the day and take off at night. -Monitor for any clinical signs or symptoms of infection and directed to call the office immediately should any occur or go to the ER.  Return in about 6 weeks (around 08/17/2022).  Vivi Barrack DPM

## 2022-08-17 ENCOUNTER — Ambulatory Visit (INDEPENDENT_AMBULATORY_CARE_PROVIDER_SITE_OTHER): Payer: BC Managed Care – PPO

## 2022-08-17 ENCOUNTER — Ambulatory Visit: Payer: BC Managed Care – PPO | Admitting: Podiatry

## 2022-08-17 DIAGNOSIS — M19072 Primary osteoarthritis, left ankle and foot: Secondary | ICD-10-CM | POA: Diagnosis not present

## 2022-08-17 NOTE — Progress Notes (Signed)
Subjective: Chief Complaint  Patient presents with   Routine Post Op    status post left foot first MPJ arthrodesis, midfoot arthrodesis. Patient is doing well. Some swelling to left foot, she is wearing compression socks.    Callouses    Possible callus to the ball of left foot. It is not painful, it is uncomfortable.     57 year old female presents the office for above concerns.  Overall since she is better.  She feels that the instability prior to the surgery.  She is getting use to not having the motion of the first MTPJ.  There is injuries or changes.  No other concerns.    Objective: AAO x3, NAD-1 regular shoe Incisions well-healed scar is formed.  No open lesions or dehiscence noted.  There is slight edema.  No erythema or warmth.  There is no area of pinpoint tenderness.   Toes are rectus.  Arthrodesis site appears stable. No pain with calf compression, swelling, warmth, erythema   Assessment: 57 year old female status post left foot first MPJ arthrodesis, midfoot arthrodesis; localized dehiscence which has since healed  Plan: -All treatment options discussed with the patient including all alternatives, risks, complications.  -X-rays obtained reviewed.  3 views of the foot were obtained.  Hardware intact with any complicating factors.  Increased consolidation noted across the arthrodesis sites.  Osteopenia is improving. -She is better in regular shoe.  Gradual increase activity as tolerated.  Continue compression over the residual edema. -Scar cream -Monitor for any clinical signs or symptoms of infection and directed to call the office immediately should any occur or go to the ER.  Return in about 3 months (around 11/17/2022), or if symptoms worsen or fail to improve.  Vivi Barrack DPM

## 2022-08-31 DIAGNOSIS — M19071 Primary osteoarthritis, right ankle and foot: Secondary | ICD-10-CM | POA: Diagnosis not present

## 2022-08-31 DIAGNOSIS — M19072 Primary osteoarthritis, left ankle and foot: Secondary | ICD-10-CM | POA: Diagnosis not present

## 2022-09-07 ENCOUNTER — Ambulatory Visit (INDEPENDENT_AMBULATORY_CARE_PROVIDER_SITE_OTHER): Payer: BC Managed Care – PPO | Admitting: Podiatry

## 2022-09-07 DIAGNOSIS — M21619 Bunion of unspecified foot: Secondary | ICD-10-CM

## 2022-09-07 DIAGNOSIS — M19072 Primary osteoarthritis, left ankle and foot: Secondary | ICD-10-CM | POA: Diagnosis not present

## 2022-09-07 DIAGNOSIS — Z9889 Other specified postprocedural states: Secondary | ICD-10-CM | POA: Diagnosis not present

## 2022-10-16 NOTE — Progress Notes (Signed)
Orthotics picked up. Oral and written break in instructions given.  Seen by the casting department.

## 2022-11-16 ENCOUNTER — Ambulatory Visit: Payer: BC Managed Care – PPO | Admitting: Podiatry

## 2022-11-16 ENCOUNTER — Encounter: Payer: Self-pay | Admitting: Podiatry

## 2022-11-16 VITALS — Temp 98.0°F | Resp 18 | Ht 66.5 in | Wt 180.0 lb

## 2022-11-16 DIAGNOSIS — Z9889 Other specified postprocedural states: Secondary | ICD-10-CM

## 2022-11-16 DIAGNOSIS — L84 Corns and callosities: Secondary | ICD-10-CM

## 2022-11-16 NOTE — Progress Notes (Signed)
       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    HPI: 57 y.o. female presents today with history of left 1st MPJ arthrodesis and midfoot fusion 12/28/21.  Overall she states that she has been doing well with use of the custom orthotics and her gait has improved.  She does present with complaint of callus to the left plantar first metatarsal phalangeal joint region which is uncomfortable to the patient when she walks without the inserts. Also reports some intermittent localized swelling localized over the left first intermetatarsal space. No injuries or other changes.  Past Medical History:  Diagnosis Date   Deafness in left ear    Partial deafness of right ear    hearing aid for right ear    Thyroid disease     Past Surgical History:  Procedure Laterality Date   CESAREAN SECTION     IVF     laprascopic surgery      No Known Allergies  ROS    Physical Exam: Vitals:   11/16/22 0937  Resp: 18  Temp: 98 F (36.7 C)  SpO2: 100%    Objective AAO x3, NAD-1 regular shoe  Incisions well-healed. Cicatrix appreciated to surgical sites without hypertrophic tissue. There is slight edema to first webspace dorsally.  No erythema or warmth.  There is no area of pinpoint tenderness. No prominent hardware on palpation.  Toes are rectus.  Arthrodesis site appears stable. No pain with calf compression, swelling, warmth, erythema  Hyperkeratotic tissue to plantar left first MPJ region which appears stable.  Assessment/Plan of Care: 1. Status post left foot surgery   2. Callus of foot      No orders of the defined types were placed in this encounter.  ORTHOTICS, PODIATRY (AMBULATORY)  Discussed clinical findings with patient today.  Plan: -Overall do feel that patient is progressing appropriately through postoperative course.  Dispensed ankle compression stocking to manage localized  edema -Order placed for orthotics modifications in effort to offload the first metatarsophalangeal joint to alleviate callus. Anticipate follow up with pedorthist for this in 2-3 weeks. Dancer pad applied today -Advised patient to avoid ambulating barefoot and to utilize house shoe -Advised patient to apply vitamin E oil or scar cream to incision sites as they will continue to remodel. - Would like to have patient see Dr. Ardelle Anton following orthotics modification.    Faduma Cho L. Marchia Bond, AACFAS [facility]     [address] [place], [place] [address]                Office [phone]  Fax [phone]

## 2022-11-16 NOTE — Patient Instructions (Signed)
Schedule follow up with Nicki [place] for orthotics modifications

## 2022-11-30 ENCOUNTER — Ambulatory Visit: Payer: BC Managed Care – PPO

## 2022-11-30 NOTE — Progress Notes (Addendum)
Patient was in to see me today for FU on orthotics she received few weeks ago. Orthotics have been helping quite a bit patient is still concerned with callousing on left 1st MT and will be making appt to see if Dr. Evaristo Bury cut down, I added another offload under 1st MTPJ / left for greater offload. Patient was happy with offload pad I gave her 2 dancer pads she can wear when barefoot, however explained that she shouldn't go with out foot wear often and suggested she get a hard sole house shoe / slipper she can wear her orthotics in  Patient will FU PRN  Addison Bailey Cped, CFo, CFm

## 2022-11-30 NOTE — Progress Notes (Signed)
Patient was in to see me today for FU on orthotics she received few weeks ago. Orthotics have been helping quite a bit patient is still concerned with callousing on left 1st MT and will be making appt to see if Dr. Evaristo Bury cut down, I added another offload under 1st MTPJ / left for greater offload. Patient was happy with offload pad I gave her 2 dancer pads she can wear when barefoot, however explained that she shouldn't go with out foot wear often and suggested she get a hard sole house shoe / slipper she can wear her orthotics in  Patient will FU PRN  Addison Bailey Cped, CFo, CFm

## 2022-12-08 ENCOUNTER — Other Ambulatory Visit: Payer: Self-pay | Admitting: Obstetrics and Gynecology

## 2022-12-08 DIAGNOSIS — E039 Hypothyroidism, unspecified: Secondary | ICD-10-CM | POA: Diagnosis not present

## 2022-12-08 DIAGNOSIS — Z23 Encounter for immunization: Secondary | ICD-10-CM | POA: Diagnosis not present

## 2022-12-08 DIAGNOSIS — N649 Disorder of breast, unspecified: Secondary | ICD-10-CM

## 2022-12-18 ENCOUNTER — Encounter: Payer: Self-pay | Admitting: Podiatry

## 2022-12-18 ENCOUNTER — Ambulatory Visit: Payer: BC Managed Care – PPO | Admitting: Podiatry

## 2022-12-18 DIAGNOSIS — M25872 Other specified joint disorders, left ankle and foot: Secondary | ICD-10-CM | POA: Diagnosis not present

## 2022-12-18 DIAGNOSIS — L84 Corns and callosities: Secondary | ICD-10-CM

## 2022-12-18 NOTE — Patient Instructions (Signed)
You can also use UREA 40% CREAM on the callus

## 2022-12-24 NOTE — Progress Notes (Signed)
Subjective: Chief Complaint  Patient presents with   Callouses    PATIENT STATES THAT CALLOUSES BE BOTHERING HER , PATIENT STATES EVEN STANDING IN THE SHOWER IT HURTS AND IT IS UNCOMFORTABLE.    57 year old female presents for the above concerns.  She has a painful callus submetatarsal 1 on her left foot.  She tries to keep an offloading pad.  No recent injuries.  No splinter redness or drainage.  No open lesions.  Objective: AAO x3, NAD DP/PT pulses palpable bilaterally, CRT less than 3 seconds Hyperkeratotic lesion submetatarsal 1 in the sesamoid.  There is prominent sesamoid plantarly.  There is no underlying ulceration, drainage or any signs of infection.  No pain with calf compression, swelling, warmth, erythema  Assessment: Prominent sesamoid, hyperkeratotic lesion  Plan: -All treatment options discussed with the patient including all alternatives, risks, complications.  -Sharply debrided hyperkeratotic lesion with any complications or bleeding.  Continue offloading pads, metatarsal pads or dancer pads to help with. -Discussed moisturizer, urea cream he does this well. -Orthotics to offload -Patient encouraged to call the office with any questions, concerns, change in symptoms.   Vivi Barrack DPM

## 2022-12-25 DIAGNOSIS — Z1231 Encounter for screening mammogram for malignant neoplasm of breast: Secondary | ICD-10-CM | POA: Diagnosis not present

## 2023-01-22 DIAGNOSIS — H9313 Tinnitus, bilateral: Secondary | ICD-10-CM | POA: Diagnosis not present

## 2023-02-05 DIAGNOSIS — H905 Unspecified sensorineural hearing loss: Secondary | ICD-10-CM | POA: Diagnosis not present

## 2023-02-08 DIAGNOSIS — Z124 Encounter for screening for malignant neoplasm of cervix: Secondary | ICD-10-CM | POA: Diagnosis not present

## 2023-02-08 DIAGNOSIS — Z1151 Encounter for screening for human papillomavirus (HPV): Secondary | ICD-10-CM | POA: Diagnosis not present

## 2023-02-08 DIAGNOSIS — Z01419 Encounter for gynecological examination (general) (routine) without abnormal findings: Secondary | ICD-10-CM | POA: Diagnosis not present

## 2023-02-08 DIAGNOSIS — Z6825 Body mass index (BMI) 25.0-25.9, adult: Secondary | ICD-10-CM | POA: Diagnosis not present

## 2023-03-23 DIAGNOSIS — L814 Other melanin hyperpigmentation: Secondary | ICD-10-CM | POA: Diagnosis not present

## 2023-03-23 DIAGNOSIS — L821 Other seborrheic keratosis: Secondary | ICD-10-CM | POA: Diagnosis not present

## 2023-03-23 DIAGNOSIS — D225 Melanocytic nevi of trunk: Secondary | ICD-10-CM | POA: Diagnosis not present

## 2023-05-21 DIAGNOSIS — R739 Hyperglycemia, unspecified: Secondary | ICD-10-CM | POA: Diagnosis not present

## 2023-05-21 DIAGNOSIS — E039 Hypothyroidism, unspecified: Secondary | ICD-10-CM | POA: Diagnosis not present

## 2023-05-21 DIAGNOSIS — E785 Hyperlipidemia, unspecified: Secondary | ICD-10-CM | POA: Diagnosis not present

## 2023-05-28 DIAGNOSIS — Z0000 Encounter for general adult medical examination without abnormal findings: Secondary | ICD-10-CM | POA: Diagnosis not present

## 2023-05-28 DIAGNOSIS — E039 Hypothyroidism, unspecified: Secondary | ICD-10-CM | POA: Diagnosis not present

## 2023-10-19 DIAGNOSIS — J029 Acute pharyngitis, unspecified: Secondary | ICD-10-CM | POA: Diagnosis not present

## 2023-10-19 DIAGNOSIS — R051 Acute cough: Secondary | ICD-10-CM | POA: Diagnosis not present

## 2023-10-19 DIAGNOSIS — U071 COVID-19: Secondary | ICD-10-CM | POA: Diagnosis not present

## 2023-11-29 DIAGNOSIS — E781 Pure hyperglyceridemia: Secondary | ICD-10-CM | POA: Diagnosis not present

## 2023-11-29 DIAGNOSIS — E039 Hypothyroidism, unspecified: Secondary | ICD-10-CM | POA: Diagnosis not present

## 2023-11-29 DIAGNOSIS — Z23 Encounter for immunization: Secondary | ICD-10-CM | POA: Diagnosis not present

## 2023-11-29 DIAGNOSIS — R030 Elevated blood-pressure reading, without diagnosis of hypertension: Secondary | ICD-10-CM | POA: Diagnosis not present
